# Patient Record
Sex: Female | Born: 2000 | Hispanic: Yes | Marital: Single | Smoking: Never smoker
Health system: Southern US, Community
[De-identification: ages and names within clinical notes are randomized; demographics above are authoritative.]

## PROBLEM LIST (undated history)

## (undated) DIAGNOSIS — R519 Headache, unspecified: Secondary | ICD-10-CM

## (undated) DIAGNOSIS — Z789 Other specified health status: Secondary | ICD-10-CM

## (undated) DIAGNOSIS — F32A Depression, unspecified: Secondary | ICD-10-CM

## (undated) HISTORY — PX: NO PAST SURGERIES: SHX2092

## (undated) HISTORY — PX: WISDOM TOOTH EXTRACTION: SHX21

---

## 2019-07-20 ENCOUNTER — Encounter (HOSPITAL_COMMUNITY): Payer: Self-pay

## 2019-07-20 ENCOUNTER — Ambulatory Visit (HOSPITAL_COMMUNITY)
Admission: EM | Admit: 2019-07-20 | Discharge: 2019-07-20 | Disposition: A | Discharge status: Home / Self Care | Payer: Self-pay | Attending: Family Medicine | Admitting: Family Medicine

## 2019-07-20 ENCOUNTER — Other Ambulatory Visit: Payer: Self-pay

## 2019-07-20 DIAGNOSIS — L259 Unspecified contact dermatitis, unspecified cause: Secondary | ICD-10-CM

## 2019-07-20 DIAGNOSIS — R220 Localized swelling, mass and lump, head: Secondary | ICD-10-CM

## 2019-07-20 MED ORDER — METHYLPREDNISOLONE SODIUM SUCC 125 MG IJ SOLR
125.0000 mg | Freq: Once | INTRAMUSCULAR | Status: AC
  Administered 2019-07-20: 125 mg via INTRAMUSCULAR

## 2019-07-20 MED ORDER — METHYLPREDNISOLONE SODIUM SUCC 125 MG IJ SOLR
INTRAMUSCULAR | Status: AC
  Filled 2019-07-20: qty 2

## 2019-07-20 MED ORDER — CETIRIZINE HCL 10 MG PO TABS
10.0000 mg | ORAL_TABLET | Freq: Every day | ORAL | 0 refills | Status: DC
Start: 2019-07-20 — End: 2020-05-04

## 2019-07-20 NOTE — ED Provider Notes (Signed)
[facility]   [id number] 07/20/19 Arrival Time: 1608  Cc: ALLERGIC REACTION  SUBJECTIVE:  Sydney Kelley is a 19 y.o. female who presents with possible allergic reaction that began yesterday. Reports upper and lower lip swelling last night. Took 2 Benadryl and swelling resolved. Reports upper lip swelling again today.  Denies precipitating event, exposure, known allergy or trigger. Denies changes in medication or starting a new medication.  Has tried benadryl with relief.  There do not seem to be aggravating factors.  Reports previous symptoms in the past that resolved with no treatment on her part.   Denies fever, chills, nausea, vomiting, erythema, redness, swollen glands, oral manifestations such as throat swelling/ tingling, mouth tingling, tongue swelling/tingling, dyspnea, SOB, chest pain, abdominal pain, changes in bowel or bladder function.     ROS: As per HPI.  All other pertinent ROS negative.     History reviewed. No pertinent past medical history. History reviewed. No pertinent surgical history. No Known Allergies No current facility-administered medications on file prior to encounter.   No current outpatient medications on file prior to encounter.    Social History   Socioeconomic History  . Marital status: Single    Spouse name: Not on file  . Number of children: Not on file  . Years of education: Not on file  . Highest education level: Not on file  Occupational History  . Not on file  Tobacco Use  . Smoking status: Never Smoker  . Smokeless tobacco: Never Used  Substance and Sexual Activity  . Alcohol use: Never  . Drug use: Never  . Sexual activity: Not on file  Other Topics Concern  . Not on file  Social History Narrative  . Not on file   Social Determinants of Health   Financial Resource Strain:   . Difficulty of Paying Living Expenses:   Food Insecurity:   . Worried About Programme researcher, broadcasting/film/video in the Last Year:   . Barista in the Last  Year:   Transportation Needs:   . Freight forwarder (Medical):   Marland Kitchen Lack of Transportation (Non-Medical):   Physical Activity:   . Days of Exercise per Week:   . Minutes of Exercise per Session:   Stress:   . Feeling of Stress :   Social Connections:   . Frequency of Communication with Friends and Family:   . Frequency of Social Gatherings with Friends and Family:   . Attends Religious Services:   . Active Member of Clubs or Organizations:   . Attends Banker Meetings:   Marland Kitchen Marital Status:   Intimate Partner Violence:   . Fear of Current or Ex-Partner:   . Emotionally Abused:   Marland Kitchen Physically Abused:   . Sexually Abused:    History reviewed. No pertinent family history.   OBJECTIVE:  Vitals:   07/20/19 1621  BP: 119/78  Pulse: 80  Resp: 16  Temp: 98 F (36.7 C)  SpO2: 98%     General appearance: Alert, speaking in full sentences without difficulty HEENT:NCAT; no obvious facial swelling; Ears: EACs clear, TMs pearly gray; Eyes: PERRL.  EOM grossly intact. Nose: nares patent without rhinorrhea; Throat: tonsils nonerythematous or enlarged, uvula midline Neck: supple without LAD Lungs: clear to auscultation bilaterally without adventitious breath sounds; normal respiratory effort; no labored respirations Heart: regular rate and rhythm.  Radial pulses 2+ symmetrical bilaterally; cap refill < 2 seconds Abdomen: soft, nondistended, normal active bowel sounds; nontender to palpation; no guarding  Skin: warm and dry Psychological: alert and cooperative; normal mood and affect  ASSESSMENT & PLAN:  1. Contact dermatitis, unspecified contact dermatitis type, unspecified trigger   2. Swelling of upper lip     Meds ordered this encounter  Medications  . cetirizine (ZYRTEC ALLERGY) 10 MG tablet    Sig: Take 1 tablet (10 mg total) by mouth daily.    Dispense:  30 tablet    Refill:  0    Order Specific Question:   Supervising Provider    Answer:   Chase Picket [id number]  . methylPREDNISolone sodium succinate (SOLU-MEDROL) 125 mg/2 mL injection 125 mg    No orders of the defined types were placed in this encounter.    Solumedrol given in office today May continue to use Benadryl as needed Rest push fluids Return or follow up with PCP in 24 hours to be reevaluated and to ensure your symptoms are improving Return sooner or go to the ED if you have any new or worsening symptoms such as difficulty breathing, shortness of breath, chest pain, nausea, vomiting, throat tightness or swelling, tongue swelling or tingling, worsening lip or facial swelling, abdominal pain, changes in bowel or bladder habits, no improvement despite medications.  Reviewed expectations re: course of current medical issues. Questions answered. Outlined signs and symptoms indicating need for more acute intervention. Patient verbalized understanding. After Visit Summary given.           Faustino Congress, NP 07/20/19 1713

## 2019-07-20 NOTE — ED Triage Notes (Signed)
Pt c/o hives and upper lip swelling since yesterday. Pt took benadryl last night, none today. No airway compromise. No known allergies.

## 2019-07-20 NOTE — Discharge Instructions (Signed)
You have had an allergic reaction to something  I have sent in zyrtec for you to take daily  You have received a steroid injection in the office today  Follow up as needed.

## 2019-09-12 ENCOUNTER — Ambulatory Visit (HOSPITAL_COMMUNITY)
Admission: EM | Admit: 2019-09-12 | Discharge: 2019-09-12 | Disposition: A | Discharge status: Home / Self Care | Payer: Medicaid Other | Attending: Family Medicine | Admitting: Family Medicine

## 2019-09-12 ENCOUNTER — Encounter (HOSPITAL_COMMUNITY): Payer: Self-pay

## 2019-09-12 DIAGNOSIS — Z3201 Encounter for pregnancy test, result positive: Secondary | ICD-10-CM

## 2019-09-12 LAB — POC URINE PREG, ED: Preg Test, Ur: POSITIVE — AB

## 2019-09-12 MED ORDER — PRENATAL COMPLETE 14-0.4 MG PO TABS
1.0000 | ORAL_TABLET | Freq: Every day | ORAL | 0 refills | Status: DC
Start: 2019-09-12 — End: 2022-01-10

## 2019-09-12 NOTE — Discharge Instructions (Signed)
Pregnancy test positive Follow up with OBGYN- [phone] [facility]  For nausea:  One-half of the 25 mg Unisom sleep tablet over-the-counter tablet or two chewable 5 mg tablets can be used off-label as an antiemetic. In addition, pyridoxine 25 mg, also available over-the-counter, is taken three or four times per day;This is a reasonable, less expensive substitute for combination tablets.

## 2019-09-12 NOTE — ED Triage Notes (Signed)
Pt presents to UC after +home pregnancy test. Requesting pregnancy test. No other complaints.

## 2019-09-12 NOTE — ED Provider Notes (Signed)
[facility]    CSN: [id number] Arrival date & time: 09/12/19  1539      History   Chief Complaint Chief Complaint  Patient presents with  . Possible Pregnancy    HPI Sydney Kelley is a 19 y.o. female presenting today for pregnancy test.  Patient reports that she had a positive pregnancy test at home.  Needing proof of pregnancy to get on Medicaid.  LMP was 06/04.  She denies any abdominal pain.  She has had some mild nausea and breast tenderness.  This will be patient's first pregnancy.  HPI  History reviewed. No pertinent past medical history.  There are no problems to display for this patient.   History reviewed. No pertinent surgical history.  OB History   No obstetric history on file.      Home Medications    Prior to Admission medications   Medication Sig Start Date End Date Taking? Authorizing Provider  cetirizine (ZYRTEC ALLERGY) 10 MG tablet Take 1 tablet (10 mg total) by mouth daily. Patient not taking: Reported on 09/12/2019 07/20/19   Moshe Cipro, NP  Prenatal Vit-Fe Fumarate-FA (PRENATAL COMPLETE) 14-0.4 MG TABS Take 1 tablet by mouth daily. 09/12/19   Khiana Camino, Junius Creamer, PA-C    Family History History reviewed. No pertinent family history.  Social History Social History   Tobacco Use  . Smoking status: Never Smoker  . Smokeless tobacco: Never Used  Vaping Use  . Vaping Use: Never used  Substance Use Topics  . Alcohol use: Never  . Drug use: Never     Allergies   Patient has no known allergies.   Review of Systems Review of Systems  Constitutional: Negative for fever.  Respiratory: Negative for shortness of breath.   Cardiovascular: Negative for chest pain.  Gastrointestinal: Negative for abdominal pain, diarrhea, nausea and vomiting.  Genitourinary: Positive for menstrual problem. Negative for dysuria, flank pain, genital sores, hematuria, vaginal bleeding, vaginal discharge and vaginal pain.  Musculoskeletal: Negative  for back pain.  Skin: Negative for rash.  Neurological: Negative for dizziness, light-headedness and headaches.     Physical Exam Triage Vital Signs ED Triage Vitals [09/12/19 1615]  Enc Vitals Group     BP 112/62     Pulse Rate 89     Resp 16     Temp 98.2 F (36.8 C)     Temp Source Oral     SpO2 100 %     Weight      Height      Head Circumference      Peak Flow      Pain Score 0     Pain Loc      Pain Edu?      Excl. in GC?    No data found.  Updated Vital Signs BP 112/62 (BP Location: Right Arm)   Pulse 89   Temp 98.2 F (36.8 C) (Oral)   Resp 16   LMP 08/08/2019 (Exact Date)   SpO2 100%   Visual Acuity Right Eye Distance:   Left Eye Distance:   Bilateral Distance:    Right Eye Near:   Left Eye Near:    Bilateral Near:     Physical Exam Vitals and nursing note reviewed.  Constitutional:      Appearance: She is well-developed.     Comments: No acute distress  HENT:     Head: Normocephalic and atraumatic.     Nose: Nose normal.  Eyes:     Conjunctiva/sclera: Conjunctivae  normal.  Cardiovascular:     Rate and Rhythm: Normal rate.  Pulmonary:     Effort: Pulmonary effort is normal. No respiratory distress.  Abdominal:     General: There is no distension.  Musculoskeletal:        General: Normal range of motion.     Cervical back: Neck supple.  Skin:    General: Skin is warm and dry.  Neurological:     Mental Status: She is alert and oriented to person, place, and time.      UC Treatments / Results  Labs (all labs ordered are listed, but only abnormal results are displayed) Labs Reviewed  POC URINE PREG, ED - Abnormal; Notable for the following components:      Result Value   Preg Test, Ur POSITIVE (*)    All other components within normal limits    EKG   Radiology No results found.  Procedures Procedures (including critical care time)  Medications Ordered in UC Medications - No data to display  Initial Impression /  Assessment and Plan / UC Course  I have reviewed the triage vital signs and the nursing notes.  Pertinent labs & imaging results that were available during my care of the patient were reviewed by me and considered in my medical decision making (see chart for details).     Pregnancy test positive, provided contact for follow-up with OB/GYN.  Initiated on prenatal vitamin and provided information on prenatal care.  Discussed medicines safe in pregnancy to use for nausea. Discussed strict return precautions. Patient verbalized understanding and is agreeable with plan.   Final Clinical Impressions(s) / UC Diagnoses   Final diagnoses:  Positive pregnancy test     Discharge Instructions     Pregnancy test positive Follow up with OBGYN- [phone] [facility]  For nausea:  One-half of the 25 mg Unisom sleep tablet over-the-counter tablet or two chewable 5 mg tablets can be used off-label as an antiemetic. In addition, pyridoxine 25 mg, also available over-the-counter, is taken three or four times per day;This is a reasonable, less expensive substitute for combination tablets.     ED Prescriptions    Medication Sig Dispense Auth. Provider   Prenatal Vit-Fe Fumarate-FA (PRENATAL COMPLETE) 14-0.4 MG TABS Take 1 tablet by mouth daily. 60 tablet Shaquia Berkley, [place] C, PA-C     PDMP not reviewed this encounter.   Lew Dawes, [place] 09/12/19 1647

## 2019-10-05 ENCOUNTER — Inpatient Hospital Stay (HOSPITAL_COMMUNITY): Payer: Medicaid Other

## 2019-10-05 ENCOUNTER — Ambulatory Visit (HOSPITAL_COMMUNITY)
Admission: EM | Admit: 2019-10-05 | Discharge: 2019-10-05 | Disposition: A | Discharge status: Home / Self Care | Payer: Medicaid Other

## 2019-10-05 ENCOUNTER — Other Ambulatory Visit: Payer: Self-pay

## 2019-10-05 ENCOUNTER — Encounter (HOSPITAL_COMMUNITY): Payer: Self-pay | Admitting: Obstetrics & Gynecology

## 2019-10-05 ENCOUNTER — Inpatient Hospital Stay (HOSPITAL_COMMUNITY)
Admission: AD | Admit: 2019-10-05 | Discharge: 2019-10-05 | Disposition: A | Discharge status: Home / Self Care | Payer: Medicaid Other | Attending: Obstetrics & Gynecology | Admitting: Obstetrics & Gynecology

## 2019-10-05 DIAGNOSIS — O26891 Other specified pregnancy related conditions, first trimester: Secondary | ICD-10-CM | POA: Insufficient documentation

## 2019-10-05 DIAGNOSIS — R1030 Lower abdominal pain, unspecified: Secondary | ICD-10-CM

## 2019-10-05 DIAGNOSIS — Z3A08 8 weeks gestation of pregnancy: Secondary | ICD-10-CM | POA: Diagnosis not present

## 2019-10-05 DIAGNOSIS — R102 Pelvic and perineal pain: Secondary | ICD-10-CM | POA: Diagnosis not present

## 2019-10-05 DIAGNOSIS — R109 Unspecified abdominal pain: Secondary | ICD-10-CM | POA: Insufficient documentation

## 2019-10-05 DIAGNOSIS — O3680X0 Pregnancy with inconclusive fetal viability, not applicable or unspecified: Secondary | ICD-10-CM

## 2019-10-05 HISTORY — DX: Other specified health status: Z78.9

## 2019-10-05 LAB — URINALYSIS, ROUTINE W REFLEX MICROSCOPIC
Bilirubin Urine: NEGATIVE
Glucose, UA: NEGATIVE mg/dL
Hgb urine dipstick: NEGATIVE
Ketones, ur: NEGATIVE mg/dL
Leukocytes,Ua: NEGATIVE
Nitrite: NEGATIVE
Protein, ur: NEGATIVE mg/dL
Specific Gravity, Urine: 1.024 (ref 1.005–1.030)
pH: 8 (ref 5.0–8.0)

## 2019-10-05 LAB — WET PREP, GENITAL
Clue Cells Wet Prep HPF POC: NONE SEEN
Sperm: NONE SEEN
Trich, Wet Prep: NONE SEEN
Yeast Wet Prep HPF POC: NONE SEEN

## 2019-10-05 LAB — CBC
HCT: 35.1 % — ABNORMAL LOW (ref 36.0–46.0)
Hemoglobin: 11.4 g/dL — ABNORMAL LOW (ref 12.0–15.0)
MCH: 26.9 pg (ref 26.0–34.0)
MCHC: 32.5 g/dL (ref 30.0–36.0)
MCV: 82.8 fL (ref 80.0–100.0)
Platelets: 169 10*3/uL (ref 150–400)
RBC: 4.24 MIL/uL (ref 3.87–5.11)
RDW: 14.4 % (ref 11.5–15.5)
WBC: 7.9 10*3/uL (ref 4.0–10.5)
nRBC: 0 % (ref 0.0–0.2)

## 2019-10-05 LAB — ABO/RH: ABO/RH(D): O POS

## 2019-10-05 LAB — HIV ANTIBODY (ROUTINE TESTING W REFLEX): HIV Screen 4th Generation wRfx: NONREACTIVE

## 2019-10-05 LAB — HCG, QUANTITATIVE, PREGNANCY: hCG, Beta Chain, Quant, S: 79936 m[IU]/mL — ABNORMAL HIGH (ref <5)

## 2019-10-05 NOTE — MAU Provider Note (Signed)
°  History     CSN: [id number]  Arrival date and time: 10/05/19 1145   None     Chief Complaint  Patient presents with   Abdominal Pain   HPI  Patient is a g1p0 at [redacted]w[redacted]d who presents for abdominal pain.  Woke up this morning with abdominal pain that comes and goes. Pain is 5/10. No diarrhea, last BM yesterday. Tolerating PO without nausea or vomiting. No fevers or chills. No vaginal bleeding. LMP approx 6/4.  No new sexual partners. No abnormal vaginal discharge. No dysuria. Has not had a dating ultrasound.     Past Medical History:  Diagnosis Date   Medical history non-contributory     Past Surgical History:  Procedure Laterality Date   NO PAST SURGERIES      History reviewed. No pertinent family history.  Social History   Tobacco Use   Smoking status: Never Smoker   Smokeless tobacco: Never Used  Vaping Use   Vaping Use: Never used  Substance Use Topics   Alcohol use: Never   Drug use: Never    Allergies: No Known Allergies  Medications Prior to Admission  Medication Sig Dispense Refill Last Dose   cetirizine (ZYRTEC ALLERGY) 10 MG tablet Take 1 tablet (10 mg total) by mouth daily. (Patient not taking: Reported on 09/12/2019) 30 tablet 0    Prenatal Vit-Fe Fumarate-FA (PRENATAL COMPLETE) 14-0.4 MG TABS Take 1 tablet by mouth daily. 60 tablet 0     Review of Systems  Constitutional: Negative.   Respiratory: Negative for shortness of breath.   Cardiovascular: Negative for chest pain.  Gastrointestinal: Negative for blood in stool, constipation, diarrhea, nausea and vomiting.  Genitourinary: Negative for dyspareunia, dysuria and flank pain.  Musculoskeletal: Negative for back pain.  Neurological: Negative for dizziness.   Physical Exam   Blood pressure 121/65, pulse 81, temperature 98.4 F (36.9 C), temperature source Oral, resp. rate 16, height 5\' 4"  (1.626 m), weight 90.3 kg, last menstrual period 08/08/2019, SpO2 100 %.  Physical Exam Vitals and  nursing note reviewed.  Constitutional:      Appearance: She is well-developed.  Cardiovascular:     Heart sounds: Normal heart sounds.  Pulmonary:     Effort: Pulmonary effort is normal.  Abdominal:     General: Bowel sounds are normal.     Palpations: Abdomen is soft.     Comments: Mild suprapubic tenderness without rebound or guarding   Skin:    General: Skin is warm and dry.  Neurological:     Mental Status: She is alert.     MAU Course  Procedures  MDM -pt seen and evaluated. Pregnancy of unknown location w lower abdominal pain -UA normal -HCG 79,936 -GC/Chlamydia, HIV pending -Wet Prep w WBC, no yeast, clue cells seen  -TVUS demonstrates viable IUP  -CBC/ABO obtained  Assessment and Plan   Abdominal pain  -reassuring examination and normal IUP on TVUS -pain improved upon reassessment. Normal UA, wet prep.  -f/u pending results. Patient stable for discharge home. Provided w return precautions.    10/08/2019 10/05/2019, 2:36 PM

## 2019-10-05 NOTE — ED Notes (Signed)
Per Wallis Bamberg PA, patient needs to go directly to MAU for follow up for acute abdominal pain.  Positive pregnancy test on 09/12/2019,

## 2019-10-05 NOTE — Discharge Instructions (Signed)
Abdominal Pain During Pregnancy  Belly (abdominal) pain is common during pregnancy. There are many possible causes. Most of the time, it is not a serious problem. Other times, it can be a sign that something is wrong with the pregnancy. Always tell your doctor if you have belly pain. Follow these instructions at home:  Do not have sex or put anything in your vagina until your pain goes away completely.  Get plenty of rest until your pain gets better.  Drink enough fluid to keep your pee (urine) pale yellow.  Take over-the-counter and prescription medicines only as told by your doctor.  Keep all follow-up visits as told by your doctor. This is important. Contact a doctor if:  Your pain continues or gets worse after resting.  You have lower belly pain that: ? Comes and goes at regular times. ? Spreads to your back. ? Feels like menstrual cramps.  You have pain or burning when you pee (urinate). Get help right away if:  You have a fever or chills.  You have vaginal bleeding.  You are leaking fluid from your vagina.  You are passing tissue from your vagina.  You throw up (vomit) for more than 24 hours.  You have watery poop (diarrhea) for more than 24 hours.  Your baby is moving less than usual.  You feel very weak or faint.  You have shortness of breath.  You have very bad pain in your upper belly. Summary  Belly (abdominal) pain is common during pregnancy. There are many possible causes.  If you have belly pain during pregnancy, tell your doctor right away.  Keep all follow-up visits as told by your doctor. This is important. This information is not intended to replace advice given to you by your health care provider. Make sure you discuss any questions you have with your health care provider. Document Revised: 06/10/2018 Document Reviewed: 05/25/2016 Elsevier Patient Education  2020 Elsevier Inc.  

## 2019-10-05 NOTE — MAU Note (Signed)
Sydney Kelley is a 19 y.o. at [redacted]w[redacted]d here in MAU reporting: woke up this AM with bad abdominal pain. No bleeding or discharge.   LMP: 08/08/19  Onset of complaint: today  Pain score: 5/10  Vitals:   10/05/19 1155  BP: 121/65  Pulse: 81  Resp: 16  Temp: 98.4 F (36.9 C)  SpO2: 100%     Lab orders placed from triage: UA

## 2019-10-06 LAB — GC/CHLAMYDIA PROBE AMP (~~LOC~~) NOT AT ARMC
Chlamydia: NEGATIVE
Comment: NEGATIVE
Comment: NORMAL
Neisseria Gonorrhea: NEGATIVE

## 2019-10-21 ENCOUNTER — Ambulatory Visit: Payer: Medicaid Other | Admitting: *Deleted

## 2019-10-21 DIAGNOSIS — Z3401 Encounter for supervision of normal first pregnancy, first trimester: Secondary | ICD-10-CM

## 2019-10-21 DIAGNOSIS — Z3400 Encounter for supervision of normal first pregnancy, unspecified trimester: Secondary | ICD-10-CM | POA: Insufficient documentation

## 2019-10-21 MED ORDER — BLOOD PRESSURE MONITOR KIT
1.0000 | PACK | Freq: Once | 0 refills | Status: AC
Start: 2019-10-21 — End: 2019-10-21

## 2019-10-21 NOTE — Progress Notes (Signed)
Patient was assessed and managed by nursing staff during this encounter. I have reviewed the chart and agree with the documentation and plan. I have also made any necessary editorial changes. ° °Kitana Gage A Kodah Maret, MD °10/21/2019 4:14 PM   °

## 2019-10-21 NOTE — Progress Notes (Signed)
I connected with  Sydney Kelley on 10/21/19 by a video enabled telemedicine application and verified that I am speaking with the correct person using two identifiers.   I discussed the limitations of evaluation and management by telemedicine. The patient expressed understanding and agreed to proceed.  PRENATAL INTAKE SUMMARY  Sydney Kelley presents today New OB Nurse Interview.  LMP: 08/08/2019 EDD: 05/14/2020  OB History    Gravida  1   Para      Term      Preterm      AB      Living        SAB      TAB      Ectopic      Multiple      Live Births            I have reviewed the patient's medical, obstetrical, social, and family histories, medications, and available lab results.  SUBJECTIVE She complains of nausea.  OBJECTIVE Initial Physical Exam (New OB)  GENERAL APPEARANCE: Sounds well via televisit   ASSESSMENT Normal pregnancy  PLAN Prenatal care [facility]  Take Vit B6 and Unisom for nausea - small meals - may hold off PNV if nausea is severe. BP cuff and BabyRx ordered today

## 2019-10-28 ENCOUNTER — Encounter: Payer: Self-pay | Admitting: Advanced Practice Midwife

## 2019-10-28 ENCOUNTER — Other Ambulatory Visit: Payer: Self-pay

## 2019-10-28 ENCOUNTER — Other Ambulatory Visit (HOSPITAL_COMMUNITY)
Admission: RE | Admit: 2019-10-28 | Discharge: 2019-10-28 | Disposition: A | Discharge status: Home / Self Care | Payer: Medicaid Other | Source: Ambulatory Visit | Attending: Advanced Practice Midwife | Admitting: Advanced Practice Midwife

## 2019-10-28 ENCOUNTER — Ambulatory Visit (INDEPENDENT_AMBULATORY_CARE_PROVIDER_SITE_OTHER): Payer: Medicaid Other | Admitting: Advanced Practice Midwife

## 2019-10-28 VITALS — BP 119/72 | HR 81 | Wt 197.4 lb

## 2019-10-28 DIAGNOSIS — O219 Vomiting of pregnancy, unspecified: Secondary | ICD-10-CM

## 2019-10-28 DIAGNOSIS — Z3401 Encounter for supervision of normal first pregnancy, first trimester: Secondary | ICD-10-CM | POA: Insufficient documentation

## 2019-10-28 DIAGNOSIS — Z3A11 11 weeks gestation of pregnancy: Secondary | ICD-10-CM

## 2019-10-28 MED ORDER — DOXYLAMINE-PYRIDOXINE 10-10 MG PO TBEC: DELAYED_RELEASE_TABLET | ORAL | 5 refills | Status: DC

## 2019-10-28 MED ORDER — ASPIRIN EC 81 MG PO TBEC: 81.0000 mg | DELAYED_RELEASE_TABLET | Freq: Every day | ORAL | 11 refills | Status: DC

## 2019-10-28 NOTE — Progress Notes (Signed)
Subjective:   Sydney Kelley is a 19 y.o. G1P0 at [redacted]w[redacted]d by LMP being seen today for her first obstetrical visit.  Her obstetrical history is significant for none and has Encounter for supervision of normal first pregnancy in first trimester on their problem list.. Patient does intend to breast feed. Pregnancy history fully reviewed.  Patient reports nausea.  HISTORY: OB History  Gravida Para Term Preterm AB Living  1 0 0 0 0 0  SAB TAB Ectopic Multiple Live Births  0 0 0 0 0    # Outcome Date GA Lbr Len/2nd Weight Sex Delivery Anes PTL Lv  1 Current            Past Medical History:  Diagnosis Date  . Medical history non-contributory    Past Surgical History:  Procedure Laterality Date  . NO PAST SURGERIES     Family History  Problem Relation Age of Onset  . Diabetes Father   . Diabetes Paternal Uncle    Social History   Tobacco Use  . Smoking status: Never Smoker  . Smokeless tobacco: Never Used  Vaping Use  . Vaping Use: Never used  Substance Use Topics  . Alcohol use: Never  . Drug use: Never   No Known Allergies Current Outpatient Medications on File Prior to Visit  Medication Sig Dispense Refill  . Prenatal Vit-Fe Fumarate-FA (PRENATAL COMPLETE) 14-0.4 MG TABS Take 1 tablet by mouth daily. 60 tablet 0  . cetirizine (ZYRTEC ALLERGY) 10 MG tablet Take 1 tablet (10 mg total) by mouth daily. (Patient not taking: Reported on 10/28/2019) 30 tablet 0   No current facility-administered medications on file prior to visit.     Indications for ASA therapy (per uptodate) One of the following: Previous pregnancy with preeclampsia, especially early onset and with an adverse outcome No Multifetal gestation No Chronic hypertension No Type 1 or 2 diabetes mellitus No Chronic kidney disease No Autoimmune disease (antiphospholipid syndrome, systemic lupus erythematosus) No   Two or more of the following: Nulliparity Yes Obesity (body mass index >30 kg/m2) Yes Family  history of preeclampsia in mother or sister No Age ?35 years No Sociodemographic characteristics (African American race, low socioeconomic level) Yes Personal risk factors (eg, previous pregnancy with low birth weight or small for gestational age infant, previous adverse pregnancy outcome [eg, stillbirth], interval >10 years between pregnancies) No   Indications for early 1 hour GTT (per uptodate)  BMI >25 (>23 in Asian women) AND one of the following  Gestational diabetes mellitus in a previous pregnancy No Glycated hemoglobin ?5.7 percent (39 mmol/mol), impaired glucose tolerance, or impaired fasting glucose on previous testing No First-degree relative with diabetes No High-risk race/ethnicity (eg, African American, Latino, Native American, [place] American, Pacific Islander) Yes History of cardiovascular disease No Hypertension or on therapy for hypertension No High-density lipoprotein cholesterol level <35 mg/dL (0.81 mmol/L) and/or a triglyceride level >250 mg/dL (4.48 mmol/L) No Polycystic ovary syndrome No Physical inactivity No Other clinical condition associated with insulin resistance (eg, severe obesity, acanthosis nigricans) No Previous birth of an infant weighing ?4000 g No Previous stillbirth of unknown cause No Exam   Vitals:   10/28/19 0926  BP: 119/72  Pulse: 81  Weight: 197 lb 6.4 oz (89.5 kg)   Fetal Heart Rate (bpm): 161  Uterus:     Pelvic Exam: Perineum: no hemorrhoids, normal perineum   Vulva: normal external genitalia, no lesions   Vagina:  normal mucosa, normal discharge   Cervix:  no lesions and normal, pap smear done.    Adnexa: normal adnexa and no mass, fullness, tenderness   Bony Pelvis: average  System: General: well-developed, well-nourished female in no acute distress   Breast:  normal appearance, no masses or tenderness   Skin: normal coloration and turgor, no rashes   Neurologic: oriented, normal, negative, normal mood   Extremities: normal  strength, tone, and muscle mass, ROM of all joints is normal   HEENT PERRLA, extraocular movement intact and sclera clear, anicteric   Mouth/Teeth mucous membranes moist, pharynx normal without lesions and dental hygiene good   Neck supple and no masses   Cardiovascular: regular rate and rhythm   Respiratory:  no respiratory distress, normal breath sounds   Abdomen: soft, non-tender; bowel sounds normal; no masses,  no organomegaly     Assessment:   Pregnancy: G1P0 Patient Active Problem List   Diagnosis Date Noted  . Encounter for supervision of normal first pregnancy in first trimester 10/21/2019     Plan:    1. Encounter for supervision of normal first pregnancy in first trimester --Anticipatory guidance about next visits/weeks of pregnancy given. --Next visit in 4 weeks in office for AFP --Pt meets criteria for early glucose screen with A1C and for Baby ASA daily to prevent PEC  - Culture, OB Urine - Genetic Screening - Cervicovaginal ancillary only( [place]) - Cytology - PAP - CBC/D/Plt+RPR+Rh+ABO+Rub Ab... - Hemoglobin A1c - aspirin EC 81 MG tablet; Take 1 tablet (81 mg total) by mouth daily. Swallow whole.  Dispense: 30 tablet; Refill: 11  2. Nausea and vomiting during pregnancy prior to [redacted] weeks gestation --Pt with mostly nausea at night and in the am, worse this week than before  - Doxylamine-Pyridoxine (DICLEGIS) 10-10 MG TBEC; Take 2 tabs at bedtime. If needed, add another tab in the morning. If needed, add another tab in the afternoon, up to 4 tabs/day.  Dispense: 100 tablet; Refill: 5  Initial labs drawn. Continue prenatal vitamins. Discussed and offered genetic screening options, including Quad screen/AFP, NIPS testing, and option to decline testing. Benefits/risks/alternatives reviewed. Pt aware that anatomy US is form of genetic screening with lower accuracy in detecting trisomies than blood work.  Pt chooses genetic screening today. NIPS:  ordered. Ultrasound discussed; fetal anatomic survey: requested. Problem list reviewed and updated. The nature of [facility] with multiple MDs and other Advanced Practice Providers was explained to patient; also emphasized that residents, students are part of our team. Routine obstetric precautions reviewed. Return in about 4 weeks (around 11/25/2019).   Sharen Counter, CNM 10/28/19 10:55 AM

## 2019-10-28 NOTE — Progress Notes (Signed)
Patient presents for New OB. Patient has no concerns today.  °

## 2019-10-29 LAB — CBC/D/PLT+RPR+RH+ABO+RUB AB...
Antibody Screen: NEGATIVE
Basophils Absolute: 0 10*3/uL (ref 0.0–0.2)
Basos: 0 %
EOS (ABSOLUTE): 0.1 10*3/uL (ref 0.0–0.4)
Eos: 1 %
HCV Ab: <0.1 s/co ratio (ref 0.0–0.9)
HIV Screen 4th Generation wRfx: NONREACTIVE
Hematocrit: 38.1 % (ref 34.0–46.6)
Hemoglobin: 12 g/dL (ref 11.1–15.9)
Hepatitis B Surface Ag: NEGATIVE
Immature Grans (Abs): 0 10*3/uL (ref 0.0–0.1)
Immature Granulocytes: 0 %
Lymphocytes Absolute: 1.5 10*3/uL (ref 0.7–3.1)
Lymphs: 16 %
MCH: 27.1 pg (ref 26.6–33.0)
MCHC: 31.5 g/dL (ref 31.5–35.7)
MCV: 86 fL (ref 79–97)
Monocytes Absolute: 0.4 10*3/uL (ref 0.1–0.9)
Monocytes: 4 %
Neutrophils Absolute: 7.4 10*3/uL — ABNORMAL HIGH (ref 1.4–7.0)
Neutrophils: 79 %
Platelets: 177 10*3/uL (ref 150–450)
RBC: 4.42 x10E6/uL (ref 3.77–5.28)
RDW: 15 % (ref 11.7–15.4)
RPR Ser Ql: NONREACTIVE
Rh Factor: POSITIVE
Rubella Antibodies, IGG: <0.9 index — ABNORMAL LOW (ref >0.99)
WBC: 9.4 10*3/uL (ref 3.4–10.8)

## 2019-10-29 LAB — HEMOGLOBIN A1C
Est. average glucose Bld gHb Est-mCnc: 114 mg/dL
Hgb A1c MFr Bld: 5.6 % (ref 4.8–5.6)

## 2019-10-29 LAB — CYTOLOGY - PAP

## 2019-10-29 LAB — CERVICOVAGINAL ANCILLARY ONLY
Chlamydia: NEGATIVE
Comment: NEGATIVE
Comment: NORMAL
Neisseria Gonorrhea: NEGATIVE

## 2019-10-29 LAB — HCV INTERPRETATION

## 2019-11-01 ENCOUNTER — Encounter: Payer: Self-pay | Admitting: Advanced Practice Midwife

## 2019-11-01 DIAGNOSIS — R87612 Low grade squamous intraepithelial lesion on cytologic smear of cervix (LGSIL): Secondary | ICD-10-CM | POA: Insufficient documentation

## 2019-11-02 LAB — CULTURE, OB URINE

## 2019-11-02 LAB — URINE CULTURE, OB REFLEX

## 2019-11-03 ENCOUNTER — Telehealth: Payer: Self-pay | Admitting: Advanced Practice Midwife

## 2019-11-03 DIAGNOSIS — O2341 Unspecified infection of urinary tract in pregnancy, first trimester: Secondary | ICD-10-CM

## 2019-11-03 MED ORDER — CEFADROXIL 500 MG PO CAPS: 500.0000 mg | ORAL_CAPSULE | Freq: Two times a day (BID) | ORAL | 0 refills | Status: AC

## 2019-11-03 NOTE — Telephone Encounter (Signed)
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  No answer and no VM.  Pt does not have MyChart set up yet.  Staff to attempt to call again to notify pt about abx sent to pharmacy and need for repeat Pap in 1 year related to LSIL.

## 2019-11-05 ENCOUNTER — Encounter: Payer: Self-pay | Admitting: Advanced Practice Midwife

## 2019-11-07 ENCOUNTER — Encounter: Payer: Self-pay | Admitting: Advanced Practice Midwife

## 2019-11-14 ENCOUNTER — Telehealth: Payer: Medicaid Other | Admitting: Physician Assistant

## 2019-11-14 ENCOUNTER — Inpatient Hospital Stay (HOSPITAL_COMMUNITY)
Admission: AD | Admit: 2019-11-14 | Discharge: 2019-11-14 | Disposition: A | Discharge status: Home / Self Care | Payer: Medicaid Other | Attending: Obstetrics & Gynecology | Admitting: Obstetrics & Gynecology

## 2019-11-14 ENCOUNTER — Other Ambulatory Visit: Payer: Self-pay

## 2019-11-14 ENCOUNTER — Encounter (HOSPITAL_COMMUNITY): Payer: Self-pay | Admitting: Obstetrics & Gynecology

## 2019-11-14 DIAGNOSIS — O219 Vomiting of pregnancy, unspecified: Secondary | ICD-10-CM

## 2019-11-14 DIAGNOSIS — G43009 Migraine without aura, not intractable, without status migrainosus: Secondary | ICD-10-CM | POA: Diagnosis not present

## 2019-11-14 DIAGNOSIS — Z7982 Long term (current) use of aspirin: Secondary | ICD-10-CM | POA: Diagnosis not present

## 2019-11-14 DIAGNOSIS — Z20822 Contact with and (suspected) exposure to covid-19: Secondary | ICD-10-CM | POA: Insufficient documentation

## 2019-11-14 DIAGNOSIS — R05 Cough: Secondary | ICD-10-CM | POA: Insufficient documentation

## 2019-11-14 DIAGNOSIS — Z3A14 14 weeks gestation of pregnancy: Secondary | ICD-10-CM | POA: Diagnosis not present

## 2019-11-14 DIAGNOSIS — O99352 Diseases of the nervous system complicating pregnancy, second trimester: Secondary | ICD-10-CM | POA: Diagnosis not present

## 2019-11-14 DIAGNOSIS — R110 Nausea: Secondary | ICD-10-CM | POA: Insufficient documentation

## 2019-11-14 DIAGNOSIS — O26892 Other specified pregnancy related conditions, second trimester: Secondary | ICD-10-CM | POA: Insufficient documentation

## 2019-11-14 LAB — CBC
HCT: 36.1 % (ref 36.0–46.0)
Hemoglobin: 11.6 g/dL — ABNORMAL LOW (ref 12.0–15.0)
MCH: 26.8 pg (ref 26.0–34.0)
MCHC: 32.1 g/dL (ref 30.0–36.0)
MCV: 83.4 fL (ref 80.0–100.0)
Platelets: 193 10*3/uL (ref 150–400)
RBC: 4.33 MIL/uL (ref 3.87–5.11)
RDW: 14.6 % (ref 11.5–15.5)
WBC: 10.4 10*3/uL (ref 4.0–10.5)
nRBC: 0 % (ref 0.0–0.2)

## 2019-11-14 LAB — URINALYSIS, ROUTINE W REFLEX MICROSCOPIC
Bilirubin Urine: NEGATIVE
Glucose, UA: NEGATIVE mg/dL
Hgb urine dipstick: NEGATIVE
Ketones, ur: 20 mg/dL — AB
Leukocytes,Ua: NEGATIVE
Nitrite: NEGATIVE
Protein, ur: NEGATIVE mg/dL
Specific Gravity, Urine: 1.012 (ref 1.005–1.030)
pH: 7 (ref 5.0–8.0)

## 2019-11-14 LAB — SARS CORONAVIRUS 2 BY RT PCR (HOSPITAL ORDER, PERFORMED IN ~~LOC~~ HOSPITAL LAB): SARS Coronavirus 2: NEGATIVE

## 2019-11-14 MED ORDER — DEXAMETHASONE SODIUM PHOSPHATE 10 MG/ML IJ SOLN
10.0000 mg | Freq: Once | INTRAMUSCULAR | Status: AC
  Administered 2019-11-14: 10 mg via INTRAVENOUS
  Filled 2019-11-14: qty 1

## 2019-11-14 MED ORDER — PROCHLORPERAZINE EDISYLATE 10 MG/2ML IJ SOLN
10.0000 mg | Freq: Once | INTRAMUSCULAR | Status: AC
  Administered 2019-11-14: 10 mg via INTRAVENOUS
  Filled 2019-11-14: qty 2

## 2019-11-14 MED ORDER — LACTATED RINGERS IV BOLUS
1000.0000 mL | Freq: Once | INTRAVENOUS | Status: AC
  Administered 2019-11-14: 1000 mL via INTRAVENOUS

## 2019-11-14 MED ORDER — DIPHENHYDRAMINE HCL 50 MG/ML IJ SOLN
25.0000 mg | Freq: Once | INTRAMUSCULAR | Status: AC
  Administered 2019-11-14: 25 mg via INTRAVENOUS
  Filled 2019-11-14: qty 1

## 2019-11-14 NOTE — Progress Notes (Signed)
For the safety of you and your child, I recommend a face to face office visit with a health care provider.  Migraines in pregnancy can be normal, but it is imperative that we rule out other more life threatening things before we treat a migraine.  You need to go to the MAU at [facility] or one of the listed urgent care centers for evaluation TODAY.  Many mothers need to take medicines during their pregnancy and while nursing.  Almost all medicines pass into the breast milk in small quantities.  Most are generally considered safe for a mother to take but some medicines must be avoided.  After reviewing your E-Visit request, I recommend that you consult your OB/GYN or pediatrician for medical advice in relation to your condition and prescription medications while pregnant or breastfeeding. NOTE: If you entered your credit card information for this eVisit, you will not be charged. You may see a "hold" on your card for the $35 but that hold will drop off and you will not have a charge processed.  If you are having a true medical emergency please call 911.     For an urgent face to face visit, [place] has four urgent care centers for your convenience:    NEW:  [facility] [place] [phone] [address] [place], [place] [address] .  Monday - Friday 10 am - 6 pm     . [facility]    [phone]                  Get Driving Directions  [address] [place], [place] [address] . 10 am to 8 pm Monday-Friday . 12 pm to 8 pm Saturday-Sunday   . [facility] at [facility]  [phone]                  Get Driving Directions  [address], [address] [place], [place] [address] . 8 am to 8 pm Monday-Friday . 9 am to 6 pm Saturday . 11 am to 6 pm Sunday   . [facility] at [facility]  [phone]                  Get Driving Directions   [address] [place], [place]  [address] . 8 am to 8 pm Monday-Friday . 8 am to 4 pm Saturday-Sunday    . [facility] at [place] Directions  [phone]  [address], [address] [place], [place] [address]  . Monday-Friday, 12 PM to 6 PM    Your e-visit answers were reviewed by a board certified advanced clinical practitioner to complete your personal care plan.  Thank you for using e-Visits.  Greater than 5 minutes, yet less than 10 minutes of time have been spent researching, coordinating, and implementing care for this patient today

## 2019-11-14 NOTE — MAU Provider Note (Signed)
History     CSN: [id number]  Arrival date and time: 11/14/19 1520   First Provider Initiated Contact with Patient 11/14/19 1829      Chief Complaint  Patient presents with  . Headache  . Nausea   HPI  Sydney Kelley is a 19 y.o. G1P0 at [redacted]w[redacted]d who present to the MAU due to migraine for 5 days. She woke up with the migraine on the R side and it has slowly spread to the L side. She has not had any visual changes, weakness, tingling, numbness. She has had nausea throughout, but only vomited 1-2x (NBNB) today. She tried 500 mg of tylenol without relief. She has a history of migraines associated with her menstrual cycle, but those are normally shorter and respond to tylenol. She does report a non-productive cough that began 2 days ago. She is vaccinated for COVID 19. Denies fever, sick contacts, congestion, shortness of breath, diarrhea, constipation, vaginal bleeding, changes in discharge. She endorses a diet of vegetables and fruit for 2-3 months due to pregnancy associated nausea and reports no protein intake.   OB History    Gravida  1   Para      Term      Preterm      AB      Living        SAB      TAB      Ectopic      Multiple      Live Births              Past Medical History:  Diagnosis Date  . Medical history non-contributory     Past Surgical History:  Procedure Laterality Date  . NO PAST SURGERIES      Family History  Problem Relation Age of Onset  . Diabetes Father   . Diabetes Paternal Uncle     Social History   Tobacco Use  . Smoking status: Never Smoker  . Smokeless tobacco: Never Used  Vaping Use  . Vaping Use: Never used  Substance Use Topics  . Alcohol use: Never  . Drug use: Never    Allergies: No Known Allergies  Medications Prior to Admission  Medication Sig Dispense Refill Last Dose  . acetaminophen (TYLENOL) 500 MG tablet Take 1,000 mg by mouth every 6 (six) hours as needed.   Past Week at Unknown time  . aspirin EC 81  MG tablet Take 1 tablet (81 mg total) by mouth daily. Swallow whole. 30 tablet 11 11/14/2019 at Unknown time  . Doxylamine-Pyridoxine (DICLEGIS) 10-10 MG TBEC Take 2 tabs at bedtime. If needed, add another tab in the morning. If needed, add another tab in the afternoon, up to 4 tabs/day. 100 tablet 5 Past Month at Unknown time  . Prenatal Vit-Fe Fumarate-FA (PRENATAL COMPLETE) 14-0.4 MG TABS Take 1 tablet by mouth daily. 60 tablet 0 11/14/2019 at Unknown time  . cetirizine (ZYRTEC ALLERGY) 10 MG tablet Take 1 tablet (10 mg total) by mouth daily. (Patient not taking: Reported on 10/28/2019) 30 tablet 0     Review of Systems  Constitutional: Negative for chills and fever.  HENT: Negative for congestion, sneezing and sore throat.   Eyes: Positive for photophobia. Negative for visual disturbance.  Respiratory: Negative for cough and shortness of breath.   Cardiovascular: Negative for leg swelling.  Gastrointestinal: Positive for nausea and vomiting. Negative for abdominal pain, constipation and diarrhea.  Genitourinary: Negative for vaginal bleeding and vaginal discharge.  Neurological: Positive for headaches.  Negative for weakness.   Physical Exam   Blood pressure 120/65, pulse 90, temperature 99 F (37.2 C), temperature source Oral, resp. rate 16, height 5\' 4"  (1.626 m), weight 89.8 kg, last menstrual period 08/08/2019, SpO2 98 %.  Physical Exam Eyes:     General: No visual field deficit. Cardiovascular:     Rate and Rhythm: Normal rate and regular rhythm.     Heart sounds: Normal heart sounds.  Pulmonary:     Effort: Pulmonary effort is normal.     Breath sounds: Normal breath sounds.  Abdominal:     General: Bowel sounds are normal.     Palpations: Abdomen is soft.     Tenderness: There is no abdominal tenderness.  Skin:    General: Skin is warm and dry.     Capillary Refill: Capillary refill takes less than 2 seconds.  Neurological:     General: No focal deficit present.      Mental Status: She is alert and oriented to person, place, and time.     Cranial Nerves: No cranial nerve deficit, dysarthria or facial asymmetry.  Psychiatric:        Mood and Affect: Mood normal.        Speech: Speech normal.     MAU Course  Procedures   Meds ordered this encounter  Medications  . lactated ringers bolus 1,000 mL  . prochlorperazine (COMPAZINE) injection 10 mg  . dexamethasone (DECADRON) injection 10 mg  . diphenhydrAMINE (BENADRYL) injection 25 mg   MDM Results for orders placed or performed during the hospital encounter of 11/14/19 (from the past 24 hour(s))  Urinalysis, Routine w reflex microscopic Urine, Clean Catch     Status: Abnormal   Collection Time: 11/14/19  5:39 PM  Result Value Ref Range   Color, Urine YELLOW YELLOW   APPearance CLOUDY (A) CLEAR   Specific Gravity, Urine 1.012 1.005 - 1.030   pH 7.0 5.0 - 8.0   Glucose, UA NEGATIVE NEGATIVE mg/dL   Hgb urine dipstick NEGATIVE NEGATIVE   Bilirubin Urine NEGATIVE NEGATIVE   Ketones, ur 20 (A) NEGATIVE mg/dL   Protein, ur NEGATIVE NEGATIVE mg/dL   Nitrite NEGATIVE NEGATIVE   Leukocytes,Ua NEGATIVE NEGATIVE  CBC     Status: Abnormal   Collection Time: 11/14/19  7:00 PM  Result Value Ref Range   WBC 10.4 4.0 - 10.5 K/uL   RBC 4.33 3.87 - 5.11 MIL/uL   Hemoglobin 11.6 (L) 12.0 - 15.0 g/dL   HCT 01/14/20 36 - 46 %   MCV 83.4 80.0 - 100.0 fL   MCH 26.8 26.0 - 34.0 pg   MCHC 32.1 30.0 - 36.0 g/dL   RDW 25.4 98.2 - 64.1 %   Platelets 193 150 - 400 K/uL   nRBC 0.0 0.0 - 0.2 %    Assessment and Plan  Migraine in pregnancy  Given history of migraine headaches associated with menstrual cycle and no other concerning symptoms presentation is likely due to migraine headache worse than prior ones due to increased hormonal level in early pregnancy. - D/c home in stable condition  - given return percautions - instructions to follow up with outpatient OBGYN on already scheduled appointment 09/21 and  informed of headache specialist available if needed at [facility]   Screening for COVID In light of delta strain of COVID19 having more subtle presentation and presence of head, nausea, vomiting, and cough patient was screened for COVID19 despite vaccination status.  - will contact if COVID swab is  positive  Concern for anemia Patient was screened for anemia given recent diet. CBC showed mild anemia with normal MCV. Hgb was near her base line. No concern for anemia contributing to presentation.    Norton Blizzard 11/14/2019, 7:35 PM

## 2019-11-14 NOTE — MAU Provider Note (Signed)
History     CSN: [id number]  Arrival date and time: 11/14/19 1520   First Provider Initiated Contact with Patient 11/14/19 1829      Chief Complaint  Patient presents with  . Headache  . Nausea   HPI Sydney Kelley is a 19 y.o. G1P0 at [redacted]w[redacted]d who present to the MAU due to migraine for 5 days. She woke up with the migraine on the R side and it has slowly spread to the L side. She has not had any visual changes, weakness, tingling, numbness. She has had nausea throughout, but only vomited 1-2x today. She tried 500 mg of tylenol without relief. She has a history of migraines associated with her menstrual cycle, but those are normally shorter and respond to tylenol. She does report a non-productive cough that began 2 days ago. She is vaccinated for COVID 19. Denies fever, sick contacts, congestion, shortness of breath, diarrhea, constipation, vaginal bleeding, changes in discharge. She endorses a diet of vegetables and fruit for 2-3 months due to pregnancy associated nausea and reports no protein intake.   OB History    Gravida  1   Para      Term      Preterm      AB      Living        SAB      TAB      Ectopic      Multiple      Live Births              Past Medical History:  Diagnosis Date  . Medical history non-contributory     Past Surgical History:  Procedure Laterality Date  . NO PAST SURGERIES      Family History  Problem Relation Age of Onset  . Diabetes Father   . Diabetes Paternal Uncle     Social History   Tobacco Use  . Smoking status: Never Smoker  . Smokeless tobacco: Never Used  Vaping Use  . Vaping Use: Never used  Substance Use Topics  . Alcohol use: Never  . Drug use: Never    Allergies: No Known Allergies  Medications Prior to Admission  Medication Sig Dispense Refill Last Dose  . acetaminophen (TYLENOL) 500 MG tablet Take 1,000 mg by mouth every 6 (six) hours as needed.   Past Week at Unknown time  . aspirin EC 81 MG  tablet Take 1 tablet (81 mg total) by mouth daily. Swallow whole. 30 tablet 11 11/14/2019 at Unknown time  . Doxylamine-Pyridoxine (DICLEGIS) 10-10 MG TBEC Take 2 tabs at bedtime. If needed, add another tab in the morning. If needed, add another tab in the afternoon, up to 4 tabs/day. 100 tablet 5 Past Month at Unknown time  . Prenatal Vit-Fe Fumarate-FA (PRENATAL COMPLETE) 14-0.4 MG TABS Take 1 tablet by mouth daily. 60 tablet 0 11/14/2019 at Unknown time  . cetirizine (ZYRTEC ALLERGY) 10 MG tablet Take 1 tablet (10 mg total) by mouth daily. (Patient not taking: Reported on 10/28/2019) 30 tablet 0     Review of Systems  Constitutional: Negative.  Negative for fatigue and fever.  HENT: Negative.   Respiratory: Positive for cough. Negative for shortness of breath.   Cardiovascular: Negative.  Negative for chest pain.  Gastrointestinal: Positive for nausea and vomiting. Negative for abdominal pain, constipation and diarrhea.  Genitourinary: Negative for dysuria, vaginal bleeding and vaginal discharge.  Neurological: Positive for headaches. Negative for dizziness.   Physical Exam   Blood pressure  120/65, pulse 90, temperature 99 F (37.2 C), temperature source Oral, resp. rate 16, height 5\' 4"  (1.626 m), weight 89.8 kg, last menstrual period 08/08/2019, SpO2 98 %.  Physical Exam Vitals and nursing note reviewed.  Constitutional:      General: She is not in acute distress.    Appearance: She is well-developed.  HENT:     Head: Normocephalic.  Eyes:     General: No visual field deficit.    Pupils: Pupils are equal, round, and reactive to light.  Cardiovascular:     Rate and Rhythm: Normal rate and regular rhythm.     Heart sounds: Normal heart sounds.  Pulmonary:     Effort: Pulmonary effort is normal. No respiratory distress.     Breath sounds: Normal breath sounds.  Abdominal:     General: Bowel sounds are normal. There is no distension.     Palpations: Abdomen is soft.      Tenderness: There is no abdominal tenderness.  Skin:    General: Skin is warm and dry.  Neurological:     Mental Status: She is alert and oriented to person, place, and time.     Cranial Nerves: No cranial nerve deficit or facial asymmetry.  Psychiatric:        Behavior: Behavior normal.        Thought Content: Thought content normal.        Judgment: Judgment normal.    FHT: 145 bpm  MAU Course  Procedures Results for orders placed or performed during the hospital encounter of 11/14/19 (from the past 24 hour(s))  Urinalysis, Routine w reflex microscopic Urine, Clean Catch     Status: Abnormal   Collection Time: 11/14/19  5:39 PM  Result Value Ref Range   Color, Urine YELLOW YELLOW   APPearance CLOUDY (A) CLEAR   Specific Gravity, Urine 1.012 1.005 - 1.030   pH 7.0 5.0 - 8.0   Glucose, UA NEGATIVE NEGATIVE mg/dL   Hgb urine dipstick NEGATIVE NEGATIVE   Bilirubin Urine NEGATIVE NEGATIVE   Ketones, ur 20 (A) NEGATIVE mg/dL   Protein, ur NEGATIVE NEGATIVE mg/dL   Nitrite NEGATIVE NEGATIVE   Leukocytes,Ua NEGATIVE NEGATIVE   MDM UA LR bolus HA cocktail- compazine, benedryl, decadron IV CBC  Discussed testing for COVID-19 due to symptoms. Reviewed that it is unlikely due to vaccination status but not impossible. Patient agreeable to testing. Will call with results if not back by discharge. Covid-19 swab  Assessment and Plan   1. Migraine without aura and without status migrainosus, not intractable   2. [redacted] weeks gestation of pregnancy    -Discharge home in stable condition -Headache precautions discussed -Patient advised to follow-up with OB as scheduled for prenatal care -Patient may return to MAU as needed or if her condition were to change or worsen   01/14/20 CNM 11/14/2019, 6:59 PM

## 2019-11-14 NOTE — MAU Note (Addendum)
About 5 days ago, woke up with a migraine, hasn't gone away since. Comes and goes, but keeps getting worse. Started on left side of face working its way to left. Has had some nausea and vomiting with it.  Tried to take Tylenol for it, but it didn't help at.  Took 1000mg  Tylenol one time. No OB complaints.

## 2019-11-14 NOTE — Discharge Instructions (Signed)

## 2019-11-25 ENCOUNTER — Ambulatory Visit (INDEPENDENT_AMBULATORY_CARE_PROVIDER_SITE_OTHER): Payer: Medicaid Other | Admitting: Obstetrics & Gynecology

## 2019-11-25 ENCOUNTER — Other Ambulatory Visit: Payer: Self-pay

## 2019-11-25 VITALS — BP 116/69 | HR 84 | Wt 201.6 lb

## 2019-11-25 DIAGNOSIS — O99210 Obesity complicating pregnancy, unspecified trimester: Secondary | ICD-10-CM

## 2019-11-25 DIAGNOSIS — Z3A15 15 weeks gestation of pregnancy: Secondary | ICD-10-CM

## 2019-11-25 DIAGNOSIS — Z3402 Encounter for supervision of normal first pregnancy, second trimester: Secondary | ICD-10-CM

## 2019-11-25 DIAGNOSIS — O2341 Unspecified infection of urinary tract in pregnancy, first trimester: Secondary | ICD-10-CM

## 2019-11-25 NOTE — Progress Notes (Signed)
   PRENATAL VISIT NOTE  Subjective:  Symiah Nowotny is a 19 y.o. G1P0 at [redacted]w[redacted]d being seen today for ongoing prenatal care.  She is currently monitored for the following issues for this low-risk pregnancy and has Encounter for supervision of normal first pregnancy and LGSIL on Pap smear of cervix on their problem list.  Patient reports no complaints.  Contractions: Irritability. Vag. Bleeding: None.   . Denies leaking of fluid.   The following portions of the patient's history were reviewed and updated as appropriate: allergies, current medications, past family history, past medical history, past social history, past surgical history and problem list.   Objective:   Vitals:   11/25/19 1002  BP: 116/69  Pulse: 84  Weight: 201 lb 9.6 oz (91.4 kg)    Fetal Status:           General:  Alert, oriented and cooperative. Patient is in no acute distress.  Skin: Skin is warm and dry. No rash noted.   Cardiovascular: Normal heart rate noted  Respiratory: Normal respiratory effort, no problems with respiration noted  Abdomen: Soft, gravid, appropriate for gestational age.  Pain/Pressure: Absent     Pelvic: Cervical exam deferred        Extremities: Normal range of motion.  Edema: None  Mental Status: Normal mood and affect. Normal behavior. Normal judgment and thought content.   Assessment and Plan:  Pregnancy: G1P0 at [redacted]w[redacted]d 1. UTI (urinary tract infection) during pregnancy, first trimester Had E.coli UTI in 10/2019, this is for [facility] - Culture, OB Urine  2. Obesity in pregnancy, antepartum TWG 21 lb. Cautioned about this, will monitor diet. - [place] MFM OB DETAIL +14 WK; Future  3. [redacted] weeks gestation of pregnancy 4. Encounter for supervision of normal first pregnancy in second trimester Low risk NIPS. AFP only today. Anatomy scan to be scheduled.  - AFP, Serum, Open Spina Bifida - [place] MFM OB DETAIL +14 WK; Future  No other complaints or concerns.  Routine obstetric precautions  reviewed.  Please refer to After Visit Summary for other counseling recommendations.   Return in about 4 weeks (around 12/23/2019) for OFFICE OB Visit.  No future appointments.  Jaynie Collins, MD

## 2019-11-25 NOTE — Patient Instructions (Signed)
Return to office for any scheduled appointments. Call the office or go to the [facility] at [facility] if:  You begin to have strong, frequent contractions  Your water breaks.  Sometimes it is a big gush of fluid, sometimes it is just a trickle that keeps getting your panties wet or running down your legs  You have vaginal bleeding.  It is normal to have a small amount of spotting if your cervix was checked.   Any other obstetric concerns.   Second Trimester of Pregnancy The second trimester is from week 14 through week 27 (months 4 through 6). The second trimester is often a time when you feel your best. Your body has adjusted to being pregnant, and you begin to feel better physically. Usually, morning sickness has lessened or quit completely, you may have more energy, and you may have an increase in appetite. The second trimester is also a time when the fetus is growing rapidly. At the end of the sixth month, the fetus is about 9 inches long and weighs about 1 pounds. You will likely begin to feel the baby move (quickening) between 16 and 20 weeks of pregnancy. Body changes during your second trimester Your body continues to go through many changes during your second trimester. The changes vary from woman to woman.  Your weight will continue to increase. You will notice your lower abdomen bulging out.  You may begin to get stretch marks on your hips, abdomen, and breasts.  You may develop headaches that can be relieved by medicines. The medicines should be approved by your health care provider.  You may urinate more often because the fetus is pressing on your bladder.  You may develop or continue to have heartburn as a result of your pregnancy.  You may develop constipation because certain hormones are causing the muscles that push waste through your intestines to slow down.  You may develop hemorrhoids or swollen, bulging veins (varicose veins).  You may have  back pain. This is caused by: ? Weight gain. ? Pregnancy hormones that are relaxing the joints in your pelvis. ? A shift in weight and the muscles that support your balance.  Your breasts will continue to grow and they will continue to become tender.  Your gums may bleed and may be sensitive to brushing and flossing.  Dark spots or blotches (chloasma, mask of pregnancy) may develop on your face. This will likely fade after the baby is born.  A dark line from your belly button to the pubic area (linea nigra) may appear. This will likely fade after the baby is born.  You may have changes in your hair. These can include thickening of your hair, rapid growth, and changes in texture. Some women also have hair loss during or after pregnancy, or hair that feels dry or thin. Your hair will most likely return to normal after your baby is born. What to expect at prenatal visits During a routine prenatal visit:  You will be weighed to make sure you and the fetus are growing normally.  Your blood pressure will be taken.  Your abdomen will be measured to track your baby's growth.  The fetal heartbeat will be listened to.  Any test results from the previous visit will be discussed. Your health care provider may ask you:  How you are feeling.  If you are feeling the baby move.  If you have had any abnormal symptoms, such as leaking fluid, bleeding,   fluid, bleeding, severe headaches, or abdominal cramping.  If you are using any tobacco products, including cigarettes, chewing tobacco, and electronic cigarettes.  If you have any questions. Other tests that may be performed during your second trimester include:  Blood tests that check for: ? Low iron levels (anemia). ? High blood sugar that affects pregnant women (gestational diabetes) between 89 and 28 weeks. ? Rh antibodies. This is to check for a protein on red blood cells (Rh factor).  Urine tests to check for infections, diabetes, or protein in the  urine.  An ultrasound to confirm the proper growth and development of the baby.  An amniocentesis to check for possible genetic problems.  Fetal screens for spina bifida and Down syndrome.  HIV (human immunodeficiency virus) testing. Routine prenatal testing includes screening for HIV, unless you choose not to have this test. Follow these instructions at home: Medicines  Follow your health care provider's instructions regarding medicine use. Specific medicines may be either safe or unsafe to take during pregnancy.  Take a prenatal vitamin that contains at least 600 micrograms (mcg) of folic acid.  If you develop constipation, try taking a stool softener if your health care provider approves. Eating and drinking   Eat a balanced diet that includes fresh fruits and vegetables, whole grains, good sources of protein such as meat, eggs, or tofu, and low-fat dairy. Your health care provider will help you determine the amount of weight gain that is right for you.  Avoid raw meat and uncooked cheese. These carry germs that can cause birth defects in the baby.  If you have low calcium intake from food, talk to your health care provider about whether you should take a daily calcium supplement.  Limit foods that are high in fat and processed sugars, such as fried and sweet foods.  To prevent constipation: ? Drink enough fluid to keep your urine clear or pale yellow. ? Eat foods that are high in fiber, such as fresh fruits and vegetables, whole grains, and beans. Activity  Exercise only as directed by your health care provider. Most women can continue their usual exercise routine during pregnancy. Try to exercise for 30 minutes at least 5 days a week. Stop exercising if you experience uterine contractions.  Avoid heavy lifting, wear low heel shoes, and practice good posture.  A sexual relationship may be continued unless your health care provider directs you otherwise. Relieving pain and  discomfort  Wear a good support bra to prevent discomfort from breast tenderness.  Take warm sitz baths to soothe any pain or discomfort caused by hemorrhoids. Use hemorrhoid cream if your health care provider approves.  Rest with your legs elevated if you have leg cramps or low back pain.  If you develop varicose veins, wear support hose. Elevate your feet for 15 minutes, 3-4 times a day. Limit salt in your diet. Prenatal Care  Write down your questions. Take them to your prenatal visits.  Keep all your prenatal visits as told by your health care provider. This is important. Safety  Wear your seat belt at all times when driving.  Make a list of emergency phone numbers, including numbers for family, friends, the hospital, and police and fire departments. General instructions  Ask your health care provider for a referral to a local prenatal education class. Begin classes no later than the beginning of month 6 of your pregnancy.  Ask for help if you have counseling or nutritional needs during pregnancy. Your health care provider  can offer advice or refer you to specialists for help with various needs.  Do not use hot tubs, steam rooms, or saunas.  Do not douche or use tampons or scented sanitary pads.  Do not cross your legs for long periods of time.  Avoid cat litter boxes and soil used by cats. These carry germs that can cause birth defects in the baby and possibly loss of the fetus by miscarriage or stillbirth.  Avoid all smoking, herbs, alcohol, and unprescribed drugs. Chemicals in these products can affect the formation and growth of the baby.  Do not use any products that contain nicotine or tobacco, such as cigarettes and e-cigarettes. If you need help quitting, ask your health care provider.  Visit your dentist if you have not gone yet during your pregnancy. Use a soft toothbrush to brush your teeth and be gentle when you floss. Contact a health care provider if:  You  have dizziness.  You have mild pelvic cramps, pelvic pressure, or nagging pain in the abdominal area.  You have persistent nausea, vomiting, or diarrhea.  You have a bad smelling vaginal discharge.  You have pain when you urinate. Get help right away if:  You have a fever.  You are leaking fluid from your vagina.  You have spotting or bleeding from your vagina.  You have severe abdominal cramping or pain.  You have rapid weight gain or weight loss.  You have shortness of breath with chest pain.  You notice sudden or extreme swelling of your face, hands, ankles, feet, or legs.  You have not felt your baby move in over an hour.  You have severe headaches that do not go away when you take medicine.  You have vision changes. Summary  The second trimester is from week 14 through week 27 (months 4 through 6). It is also a time when the fetus is growing rapidly.  Your body goes through many changes during pregnancy. The changes vary from woman to woman.  Avoid all smoking, herbs, alcohol, and unprescribed drugs. These chemicals affect the formation and growth your baby.  Do not use any tobacco products, such as cigarettes, chewing tobacco, and e-cigarettes. If you need help quitting, ask your health care provider.  Contact your health care provider if you have any questions. Keep all prenatal visits as told by your health care provider. This is important. This information is not intended to replace advice given to you by your health care provider. Make sure you discuss any questions you have with your health care provider. Document Revised: 06/14/2018 Document Reviewed: 03/28/2016 Elsevier Patient Education  2020 [employer].

## 2019-11-27 LAB — AFP, SERUM, OPEN SPINA BIFIDA
AFP MoM: 0.81
AFP Value: 21.9 ng/mL
Gest. Age on Collection Date: 15.6 weeks
Maternal Age At EDD: 21.7 yr
OSBR Risk 1 IN: 10000
Test Results:: NEGATIVE
Weight: 201 [lb_av]

## 2019-11-27 LAB — URINE CULTURE, OB REFLEX

## 2019-11-27 LAB — CULTURE, OB URINE

## 2019-12-23 ENCOUNTER — Other Ambulatory Visit: Payer: Self-pay

## 2019-12-23 ENCOUNTER — Ambulatory Visit (INDEPENDENT_AMBULATORY_CARE_PROVIDER_SITE_OTHER): Payer: Medicaid Other | Admitting: Advanced Practice Midwife

## 2019-12-23 VITALS — BP 112/70 | HR 90 | Wt 209.4 lb

## 2019-12-23 DIAGNOSIS — O26899 Other specified pregnancy related conditions, unspecified trimester: Secondary | ICD-10-CM

## 2019-12-23 DIAGNOSIS — Z3A19 19 weeks gestation of pregnancy: Secondary | ICD-10-CM

## 2019-12-23 DIAGNOSIS — R102 Pelvic and perineal pain: Secondary | ICD-10-CM

## 2019-12-23 DIAGNOSIS — O2341 Unspecified infection of urinary tract in pregnancy, first trimester: Secondary | ICD-10-CM

## 2019-12-23 DIAGNOSIS — Z3401 Encounter for supervision of normal first pregnancy, first trimester: Secondary | ICD-10-CM

## 2019-12-23 MED ORDER — COMFORT FIT MATERNITY SUPP MED MISC: 1.0000 | Freq: Every day | 0 refills | Status: DC

## 2019-12-23 NOTE — Progress Notes (Signed)
   PRENATAL VISIT NOTE  Subjective:  Sydney Kelley is a 19 y.o. G1P0 at [redacted]w[redacted]d being seen today for ongoing prenatal care.  She is currently monitored for the following issues for this low-risk pregnancy and has Encounter for supervision of normal first pregnancy and LGSIL on Pap smear of cervix on their problem list.  Patient reports bilateral groin/lower abdomen pain with movement.  Contractions: Irritability. Vag. Bleeding: None.   . Denies leaking of fluid.   The following portions of the patient's history were reviewed and updated as appropriate: allergies, current medications, past family history, past medical history, past social history, past surgical history and problem list.   Objective:   Vitals:   12/23/19 1040  BP: 112/70  Pulse: 90  Weight: 209 lb 6.4 oz (95 kg)    Fetal Status: Fetal Heart Rate (bpm): 150         General:  Alert, oriented and cooperative. Patient is in no acute distress.  Skin: Skin is warm and dry. No rash noted.   Cardiovascular: Normal heart rate noted  Respiratory: Normal respiratory effort, no problems with respiration noted  Abdomen: Soft, gravid, appropriate for gestational age.  Pain/Pressure: Absent     Pelvic: Cervical exam deferred        Extremities: Normal range of motion.  Edema: None  Mental Status: Normal mood and affect. Normal behavior. Normal judgment and thought content.   Assessment and Plan:  Pregnancy: G1P0 at [redacted]w[redacted]d 1. Encounter for supervision of normal first pregnancy in first trimester --Anticipatory guidance about next visits/weeks of pregnancy given. --Next visit in 4 weeks, virtual  2. UTI (urinary tract infection) during pregnancy, first trimester --TOC neg  3. [redacted] weeks gestation of pregnancy  4. Pain of round ligament affecting pregnancy, antepartum --Rest/ice/heat/warm bath/Tylenol/pregnancy support belt  - Elastic Bandages & Supports (COMFORT FIT MATERNITY SUPP MED) MISC; 1 Device by Does not apply route  daily.  Dispense: 1 each; Refill: 0  Preterm labor symptoms and general obstetric precautions including but not limited to vaginal bleeding, contractions, leaking of fluid and fetal movement were reviewed in detail with the patient. Please refer to After Visit Summary for other counseling recommendations.   Return in about 4 weeks (around 01/20/2020).  Future Appointments  Date Time Provider Department Center  12/24/2019  8:45 AM [facility] NURSE [facility]  12/24/2019  9:00 AM [facility] US1 [facility]  01/19/2020  9:50 AM [place], Wilmer Floor, CNM [facility] None    Sharen Counter, CNM

## 2019-12-23 NOTE — Patient Instructions (Signed)

## 2019-12-24 ENCOUNTER — Other Ambulatory Visit: Payer: Self-pay | Admitting: *Deleted

## 2019-12-24 ENCOUNTER — Encounter: Payer: Self-pay | Admitting: *Deleted

## 2019-12-24 ENCOUNTER — Ambulatory Visit: Payer: Medicaid Other | Admitting: *Deleted

## 2019-12-24 ENCOUNTER — Ambulatory Visit: Payer: Medicaid Other | Attending: Obstetrics & Gynecology

## 2019-12-24 DIAGNOSIS — Z3402 Encounter for supervision of normal first pregnancy, second trimester: Secondary | ICD-10-CM

## 2019-12-24 DIAGNOSIS — Z3A15 15 weeks gestation of pregnancy: Secondary | ICD-10-CM | POA: Insufficient documentation

## 2019-12-24 DIAGNOSIS — O99210 Obesity complicating pregnancy, unspecified trimester: Secondary | ICD-10-CM | POA: Diagnosis not present

## 2019-12-24 DIAGNOSIS — N1330 Unspecified hydronephrosis: Secondary | ICD-10-CM

## 2020-01-19 ENCOUNTER — Telehealth (INDEPENDENT_AMBULATORY_CARE_PROVIDER_SITE_OTHER): Payer: Medicaid Other | Admitting: Advanced Practice Midwife

## 2020-01-19 DIAGNOSIS — Z3A23 23 weeks gestation of pregnancy: Secondary | ICD-10-CM

## 2020-01-19 DIAGNOSIS — Z3402 Encounter for supervision of normal first pregnancy, second trimester: Secondary | ICD-10-CM

## 2020-01-19 NOTE — Progress Notes (Signed)
Pt does not have BP cuff with her at time of intake.

## 2020-01-19 NOTE — Progress Notes (Signed)
OBSTETRICS PRENATAL VIRTUAL VISIT ENCOUNTER NOTE  Provider location: Center for [facility] [place]   I connected with Sydney Kelley on 01/19/20 at  9:55 AM EST by MyChart Video Encounter at home and verified that I am speaking with the correct person using two identifiers.   I discussed the limitations, risks, security and privacy concerns of performing an evaluation and management service virtually and the availability of in person appointments. I also discussed with the patient that there may be a patient responsible charge related to this service. The patient expressed understanding and agreed to proceed. Subjective:  Sydney Kelley is a 19 y.o. G1P0 at [redacted]w[redacted]d being seen today for ongoing prenatal care.  She is currently monitored for the following issues for this low-risk pregnancy and has Encounter for supervision of normal first pregnancy and LGSIL on Pap smear of cervix on their problem list.  Patient reports no complaints.  Contractions: Not present. Vag. Bleeding: None.  Movement: Present. Denies any leaking of fluid.   The following portions of the patient's history were reviewed and updated as appropriate: allergies, current medications, past family history, past medical history, past social history, past surgical history and problem list.   Objective:  There were no vitals filed for this visit.  Fetal Status:     Movement: Present     General:  Alert, oriented and cooperative. Patient is in no acute distress.  Respiratory: Normal respiratory effort, no problems with respiration noted  Mental Status: Normal mood and affect. Normal behavior. Normal judgment and thought content.  Rest of physical exam deferred due to type of encounter  Imaging: [place] MFM OB DETAIL +14 WK  Result Date: 12/24/2019 ----------------------------------------------------------------------  OBSTETRICS REPORT                       (Signed Final 12/24/2019 11:48 am)  ---------------------------------------------------------------------- Patient Info  ID #:       [id number]                          D.O.B.:  February 19, 2001 (19 yrs)  Name:       Sydney Kelley                   Visit Date: 12/24/2019 08:48 am ---------------------------------------------------------------------- Performed By  Attending:        Ma Rings MD         Ref. Address:     [address]                                                             [phone]  [place]                                                             [address]  Performed By:     Earley Brooke     [facility]  Referred By:      [facility] ---------------------------------------------------------------------- Orders  #  Description                           Code        Ordered By  1  [place] MFM OB DETAIL +14 WK               76811.01    Jaynie Collins ----------------------------------------------------------------------  #  Order #                     Accession #                Episode #  1  [id number]                   [id number]                 [id number] ---------------------------------------------------------------------- Indications  Obesity complicating pregnancy, second         O99.212  trimester (BMI 30.9)  Encounter for antenatal screening for          Z36.3  malformations (LR NIPS, NEG Horizon, NEG  AFP)  [redacted] weeks gestation of pregnancy                Z3A.19  Medical complication of pregnancy (Rubella     O26.90  Non-Immune) ---------------------------------------------------------------------- Fetal Evaluation  Num Of Fetuses:         1  Fetal Heart Rate(bpm):  145   Cardiac Activity:       Observed  Presentation:           Breech  Placenta:               Anterior Fundal  P. Cord Insertion:      Visualized  Amniotic Fluid  AFI FV:      Within  normal limits                              Largest Pocket(cm)                              5.76 ---------------------------------------------------------------------- Biometry  BPD:        50  mm     G. Age:  21w 1d         94  %    CI:        75.63   %    70 - 86                                                          FL/HC:      17.0   %    16.8 - 19.8  HC:      182.3  mm     G. Age:  20w 4d         82  %    HC/AC:      1.13        1.09 - 1.39  AC:      161.6  mm     G. Age:  21w 2d         88  %    FL/BPD:     61.8   %  FL:       30.9  mm     G. Age:  19w 4d         37  %    FL/AC:      19.1   %    20 - 24  HUM:      30.8  mm     G. Age:  20w 2d         65  %  CER:      22.4  mm     G. Age:  21w 0d         95  %  NFT:       4.2  mm  LV:        6.3  mm  CM:        5.8  mm  Est. FW:     359  gm    0 lb 13 oz      87  % ---------------------------------------------------------------------- OB History  Gravidity:    1         Term:   0        Prem:   0        SAB:   0  TOP:          0       Ectopic:  0        Living: 0 ---------------------------------------------------------------------- Gestational Age  LMP:           19w 5d        Date:  08/08/19                 EDD:   05/14/20  U/S Today:     20w 5d  EDD:   05/07/20  Best:          19w 5d     Det. By:  LMP  (08/08/19)          EDD:   05/14/20 ---------------------------------------------------------------------- Anatomy  Cranium:               Appears normal         LVOT:                   Appears normal  Cavum:                 Appears normal         Aortic Arch:            Appears normal  Ventricles:            Appears normal         Ductal Arch:            Appears normal  Choroid Plexus:        Appears normal         Diaphragm:               Appears normal  Cerebellum:            Appears normal         Stomach:                Appears normal, left                                                                        sided  Posterior Fossa:       Appears normal         Abdomen:                Appears normal  Nuchal Fold:           Appears normal         Abdominal Wall:         Appears nml (cord                                                                        insert, abd wall)  Face:                  Appears normal         Cord Vessels:           Appears normal (3                         (orbits and profile)                           vessel cord)  Lips:                  Appears normal         Kidneys:  Left sided                                                                        pyelectasis, 4.1                                                                        mm  Palate:                Appears normal         Bladder:                Appears normal  Thoracic:              Appears normal         Spine:                  Appears normal  Heart:                 Appears normal         Upper Extremities:      Appears normal                         (4CH, axis, and                         situs)  RVOT:                  Appears normal         Lower Extremities:      Appears normal  Other:  Fetus appears to be a female. Heels and 5th digit visualized. Nasal          bone visualized. ---------------------------------------------------------------------- Cervix Uterus Adnexa  Cervix  Length:           4.24  cm.  Normal appearance by transabdominal scan.  Right Ovary  Within normal limits.  Left Ovary  Within normal limits.  Adnexa  No abnormality visualized. ---------------------------------------------------------------------- Comments  This patient was seen for a detailed fetal anatomy scan due  to maternal obesity.  She denies any significant past medical history and denies  any problems in her current pregnancy.  She had a cell free DNA  test earlier in her pregnancy which  indicated a low risk for trisomy 19, 61, and 13. A female fetus is  predicted.  She was informed that the fetal growth and amniotic fluid  level were appropriate for her gestational age.  Mild left pyelectasis measuring  0.4 cm dilated was noted on  today's ultrasound exam.  The implications and management  of pylectasis was discussed with the patient. She was  advised that the pyelectasis noted on prenatal ultrasounds  will often resolve spontaneously after birth.  However, there  may also be other processes such as an obstruction or reflux  causing this finding that may require treatment after birth.  She was advised that we  will continue to follow her closely to  assess this finding.  Should the renal pelvis continue to  enlarge, we will consider a referral to pediatric  urology/nephrology for a prenatal consultation.  The small association between pyelectasis and Down  syndrome was discussed.  She was offered and declined an  amniocentesis today for definitive diagnosis of fetal  aneuploidy.  She was reassured by the low risk for Down  syndrome indicated by her cell free DNA test.  The patient was informed that anomalies may be missed due  to technical limitations. If the fetus is in a suboptimal position  or maternal habitus is increased, visualization of the fetus in  the maternal uterus may be impaired.  A follow-up exam was scheduled in 5 weeks to reassess the  fetal kidneys. ----------------------------------------------------------------------                   Ma Rings, MD Electronically Signed Final Report   12/24/2019 11:48 am ----------------------------------------------------------------------   Assessment and Plan:  Pregnancy: G1P0 at [redacted]w[redacted]d 1. Encounter for supervision of normal first pregnancy in second trimester --Anticipatory guidance about next visits/weeks of pregnancy given. --Next visit in 4 weeks for 28 week labs, TDAP, flu vaccine if not already  completed --Pt without BP cuff today, she is not at home. Will enter into Babys[employer]s or call office with BP today.  No s/sx of PEC today.  Preterm labor symptoms and general obstetric precautions including but not limited to vaginal bleeding, contractions, leaking of fluid and fetal movement were reviewed in detail with the patient. I discussed the assessment and treatment plan with the patient. The patient was provided an opportunity to ask questions and all were answered. The patient agreed with the plan and demonstrated an understanding of the instructions. The patient was advised to call back or seek an in-person office evaluation/go to [facility]U at [facility] for any urgent or concerning symptoms. Please refer to After Visit Summary for other counseling recommendations.   I provided 7 minutes of face-to-face time during this encounter.  Return in about 4 weeks (around 02/16/2020).  Future Appointments  Date Time Provider Department Center  01/28/2020  9:30 AM [facility] NURSE [facility]  01/28/2020  9:45 AM [facility] US5 [facility]    Sharen Counter, CNM Center for [employer], [facility]

## 2020-01-28 ENCOUNTER — Other Ambulatory Visit: Payer: Self-pay

## 2020-01-28 ENCOUNTER — Encounter: Payer: Self-pay | Admitting: *Deleted

## 2020-01-28 ENCOUNTER — Other Ambulatory Visit: Payer: Self-pay | Admitting: *Deleted

## 2020-01-28 ENCOUNTER — Ambulatory Visit: Payer: Medicaid Other | Attending: Obstetrics

## 2020-01-28 ENCOUNTER — Ambulatory Visit: Payer: Medicaid Other | Admitting: *Deleted

## 2020-01-28 DIAGNOSIS — N1330 Unspecified hydronephrosis: Secondary | ICD-10-CM

## 2020-01-28 DIAGNOSIS — Z3A24 24 weeks gestation of pregnancy: Secondary | ICD-10-CM

## 2020-01-28 DIAGNOSIS — E669 Obesity, unspecified: Secondary | ICD-10-CM | POA: Diagnosis not present

## 2020-01-28 DIAGNOSIS — O99212 Obesity complicating pregnancy, second trimester: Secondary | ICD-10-CM

## 2020-01-28 DIAGNOSIS — Z283 Underimmunization status: Secondary | ICD-10-CM

## 2020-01-28 DIAGNOSIS — Z3402 Encounter for supervision of normal first pregnancy, second trimester: Secondary | ICD-10-CM

## 2020-01-28 DIAGNOSIS — Z363 Encounter for antenatal screening for malformations: Secondary | ICD-10-CM

## 2020-02-16 ENCOUNTER — Other Ambulatory Visit: Payer: Self-pay

## 2020-02-16 ENCOUNTER — Encounter: Payer: Self-pay | Admitting: Advanced Practice Midwife

## 2020-02-16 ENCOUNTER — Other Ambulatory Visit: Payer: Medicaid Other

## 2020-02-16 ENCOUNTER — Ambulatory Visit (INDEPENDENT_AMBULATORY_CARE_PROVIDER_SITE_OTHER): Payer: Medicaid Other | Admitting: Advanced Practice Midwife

## 2020-02-16 VITALS — Wt 234.0 lb

## 2020-02-16 DIAGNOSIS — Z3480 Encounter for supervision of other normal pregnancy, unspecified trimester: Secondary | ICD-10-CM

## 2020-02-16 DIAGNOSIS — Z3402 Encounter for supervision of normal first pregnancy, second trimester: Secondary | ICD-10-CM

## 2020-02-16 NOTE — Patient Instructions (Signed)
Third Trimester of Pregnancy The third trimester is from week 28 through week 40 (months 7 through 9). The third trimester is a time when the unborn baby (fetus) is growing rapidly. At the end of the ninth month, the fetus is about 20 inches in length and weighs 6-10 pounds. Body changes during your third trimester Your body will continue to go through many changes during pregnancy. The changes vary from woman to woman. During the third trimester:  Your weight will continue to increase. You can expect to gain 25-35 pounds (11-16 kg) by the end of the pregnancy.  You may begin to get stretch marks on your hips, abdomen, and breasts.  You may urinate more often because the fetus is moving lower into your pelvis and pressing on your bladder.  You may develop or continue to have heartburn. This is caused by increased hormones that slow down muscles in the digestive tract.  You may develop or continue to have constipation because increased hormones slow digestion and cause the muscles that push waste through your intestines to relax.  You may develop hemorrhoids. These are swollen veins (varicose veins) in the rectum that can itch or be painful.  You may develop swollen, bulging veins (varicose veins) in your legs.  You may have increased body aches in the pelvis, back, or thighs. This is due to weight gain and increased hormones that are relaxing your joints.  You may have changes in your hair. These can include thickening of your hair, rapid growth, and changes in texture. Some women also have hair loss during or after pregnancy, or hair that feels dry or thin. Your hair will most likely return to normal after your baby is born.  Your breasts will continue to grow and they will continue to become tender. A yellow fluid (colostrum) may leak from your breasts. This is the first milk you are producing for your baby.  Your belly button may stick out.  You may notice more swelling in your hands,  face, or ankles.  You may have increased tingling or numbness in your hands, arms, and legs. The skin on your belly may also feel numb.  You may feel short of breath because of your expanding uterus.  You may have more problems sleeping. This can be caused by the size of your belly, increased need to urinate, and an increase in your body's metabolism.  You may notice the fetus "dropping," or moving lower in your abdomen (lightening).  You may have increased vaginal discharge.  You may notice your joints feel loose and you may have pain around your pelvic bone. What to expect at prenatal visits You will have prenatal exams every 2 weeks until week 36. Then you will have weekly prenatal exams. During a routine prenatal visit:  You will be weighed to make sure you and the baby are growing normally.  Your blood pressure will be taken.  Your abdomen will be measured to track your baby's growth.  The fetal heartbeat will be listened to.  Any test results from the previous visit will be discussed.  You may have a cervical check near your due date to see if your cervix has softened or thinned (effaced).  You will be tested for Group B streptococcus. This happens between 35 and 37 weeks. Your health care provider may ask you:  What your birth plan is.  How you are feeling.  If you are feeling the baby move.  If you have had any abnormal   symptoms, such as leaking fluid, bleeding, severe headaches, or abdominal cramping.  If you are using any tobacco products, including cigarettes, chewing tobacco, and electronic cigarettes.  If you have any questions. Other tests or screenings that may be performed during your third trimester include:  Blood tests that check for low iron levels (anemia).  Fetal testing to check the health, activity level, and growth of the fetus. Testing is done if you have certain medical conditions or if there are problems during the pregnancy.  Nonstress test  (NST). This test checks the health of your baby to make sure there are no signs of problems, such as the baby not getting enough oxygen. During this test, a belt is placed around your belly. The baby is made to move, and its heart rate is monitored during movement. What is false labor? False labor is a condition in which you feel small, irregular tightenings of the muscles in the womb (contractions) that usually go away with rest, changing position, or drinking water. These are called Braxton Hicks contractions. Contractions may last for hours, days, or even weeks before true labor sets in. If contractions come at regular intervals, become more frequent, increase in intensity, or become painful, you should see your health care provider. What are the signs of labor?  Abdominal cramps.  Regular contractions that start at 10 minutes apart and become stronger and more frequent with time.  Contractions that start on the top of the uterus and spread down to the lower abdomen and back.  Increased pelvic pressure and dull back pain.  A watery or bloody mucus discharge that comes from the vagina.  Leaking of amniotic fluid. This is also known as your "water breaking." It could be a slow trickle or a gush. Let your health care provider know if it has a color or strange odor. If you have any of these signs, call your health care provider right away, even if it is before your due date. Follow these instructions at home: Medicines  Follow your health care provider's instructions regarding medicine use. Specific medicines may be either safe or unsafe to take during pregnancy.  Take a prenatal vitamin that contains at least 600 micrograms (mcg) of folic acid.  If you develop constipation, try taking a stool softener if your health care provider approves. Eating and drinking   Eat a balanced diet that includes fresh fruits and vegetables, whole grains, good sources of protein such as meat, eggs, or tofu,  and low-fat dairy. Your health care provider will help you determine the amount of weight gain that is right for you.  Avoid raw meat and uncooked cheese. These carry germs that can cause birth defects in the baby.  If you have low calcium intake from food, talk to your health care provider about whether you should take a daily calcium supplement.  Eat four or five small meals rather than three large meals a day.  Limit foods that are high in fat and processed sugars, such as fried and sweet foods.  To prevent constipation: ? Drink enough fluid to keep your urine clear or pale yellow. ? Eat foods that are high in fiber, such as fresh fruits and vegetables, whole grains, and beans. Activity  Exercise only as directed by your health care provider. Most women can continue their usual exercise routine during pregnancy. Try to exercise for 30 minutes at least 5 days a week. Stop exercising if you experience uterine contractions.  Avoid heavy lifting.  Do   not exercise in extreme heat or humidity, or at high altitudes.  Wear low-heel, comfortable shoes.  Practice good posture.  You may continue to have sex unless your health care provider tells you otherwise. Relieving pain and discomfort  Take frequent breaks and rest with your legs elevated if you have leg cramps or low back pain.  Take warm sitz baths to soothe any pain or discomfort caused by hemorrhoids. Use hemorrhoid cream if your health care provider approves.  Wear a good support bra to prevent discomfort from breast tenderness.  If you develop varicose veins: ? Wear support pantyhose or compression stockings as told by your healthcare provider. ? Elevate your feet for 15 minutes, 3-4 times a day. Prenatal care  Write down your questions. Take them to your prenatal visits.  Keep all your prenatal visits as told by your health care provider. This is important. Safety  Wear your seat belt at all times when driving.  Make  a list of emergency phone numbers, including numbers for family, friends, the hospital, and police and fire departments. General instructions  Avoid cat litter boxes and soil used by cats. These carry germs that can cause birth defects in the baby. If you have a cat, ask someone to clean the litter box for you.  Do not travel far distances unless it is absolutely necessary and only with the approval of your health care provider.  Do not use hot tubs, steam rooms, or saunas.  Do not drink alcohol.  Do not use any products that contain nicotine or tobacco, such as cigarettes and e-cigarettes. If you need help quitting, ask your health care provider.  Do not use any medicinal herbs or unprescribed drugs. These chemicals affect the formation and growth of the baby.  Do not douche or use tampons or scented sanitary pads.  Do not cross your legs for long periods of time.  To prepare for the arrival of your baby: ? Take prenatal classes to understand, practice, and ask questions about labor and delivery. ? Make a trial run to the hospital. ? Visit the hospital and tour the maternity area. ? Arrange for maternity or paternity leave through employers. ? Arrange for family and friends to take care of pets while you are in the hospital. ? Purchase a rear-facing car seat and make sure you know how to install it in your car. ? Pack your hospital bag. ? Prepare the baby's nursery. Make sure to remove all pillows and stuffed animals from the baby's crib to prevent suffocation.  Visit your dentist if you have not gone during your pregnancy. Use a soft toothbrush to brush your teeth and be gentle when you floss. Contact a health care provider if:  You are unsure if you are in labor or if your water has broken.  You become dizzy.  You have mild pelvic cramps, pelvic pressure, or nagging pain in your abdominal area.  You have lower back pain.  You have persistent nausea, vomiting, or  diarrhea.  You have an unusual or bad smelling vaginal discharge.  You have pain when you urinate. Get help right away if:  Your water breaks before 37 weeks.  You have regular contractions less than 5 minutes apart before 37 weeks.  You have a fever.  You are leaking fluid from your vagina.  You have spotting or bleeding from your vagina.  You have severe abdominal pain or cramping.  You have rapid weight loss or weight gain.  You have   shortness of breath with chest pain.  You notice sudden or extreme swelling of your face, hands, ankles, feet, or legs.  Your baby makes fewer than 10 movements in 2 hours.  You have severe headaches that do not go away when you take medicine.  You have vision changes. Summary  The third trimester is from week 28 through week 40, months 7 through 9. The third trimester is a time when the unborn baby (fetus) is growing rapidly.  During the third trimester, your discomfort may increase as you and your baby continue to gain weight. You may have abdominal, leg, and back pain, sleeping problems, and an increased need to urinate.  During the third trimester your breasts will keep growing and they will continue to become tender. A yellow fluid (colostrum) may leak from your breasts. This is the first milk you are producing for your baby.  False labor is a condition in which you feel small, irregular tightenings of the muscles in the womb (contractions) that eventually go away. These are called Braxton Hicks contractions. Contractions may last for hours, days, or even weeks before true labor sets in.  Signs of labor can include: abdominal cramps; regular contractions that start at 10 minutes apart and become stronger and more frequent with time; watery or bloody mucus discharge that comes from the vagina; increased pelvic pressure and dull back pain; and leaking of amniotic fluid. This information is not intended to replace advice given to you by your  health care provider. Make sure you discuss any questions you have with your health care provider. Document Revised: 06/13/2018 Document Reviewed: 03/28/2016 Elsevier Patient Education  2020 [employer]  

## 2020-02-16 NOTE — Progress Notes (Signed)
   PRENATAL VISIT NOTE  Subjective:  Sydney Kelley is a 19 y.o. G1P0 at [redacted]w[redacted]d being seen today for ongoing prenatal care.  She is currently monitored for the following issues for this low-risk pregnancy and has Encounter for supervision of normal first pregnancy and LGSIL on Pap smear of cervix on their problem list.  Patient reports no complaints.  Contractions: Not present. Vag. Bleeding: None.  Movement: Present. Denies leaking of fluid.   The following portions of the patient's history were reviewed and updated as appropriate: allergies, current medications, past family history, past medical history, past social history, past surgical history and problem list.   Objective:   Vitals:   02/16/20 0908  Weight: 234 lb (106.1 kg)    Fetal Status: Fetal Heart Rate (bpm): 143 Fundal Height: 28 cm Movement: Present     General:  Alert, oriented and cooperative. Patient is in no acute distress.  Skin: Skin is warm and dry. No rash noted.   Cardiovascular: Normal heart rate noted  Respiratory: Normal respiratory effort, no problems with respiration noted  Abdomen: Soft, gravid, appropriate for gestational age.  Pain/Pressure: Absent     Pelvic: Cervical exam deferred        Extremities: Normal range of motion.  Edema: None  Mental Status: Normal mood and affect. Normal behavior. Normal judgment and thought content.   Assessment and Plan:  Pregnancy: G1P0 at [redacted]w[redacted]d 1. Supervision of other normal pregnancy, antepartum --Anticipatory guidance about next visits/weeks of pregnancy given. --Next visit in 4 weeks in office, pt to enter BP in [employer] weekly    Preterm labor symptoms and general obstetric precautions including but not limited to vaginal bleeding, contractions, leaking of fluid and fetal movement were reviewed in detail with the patient. Please refer to After Visit Summary for other counseling recommendations.   Return in about 4 weeks (around 03/15/2020).  Future  Appointments  Date Time Provider Department Center  02/16/2020  9:55 AM [facility], Wilmer Floor, CNM [facility] None  03/22/2020  9:45 AM [facility] NURSE [facility]  03/22/2020 10:00 AM [facility] US1 [facility]    Sharen Counter, CNM

## 2020-02-16 NOTE — Progress Notes (Signed)
ROB   2 hr GTT   CC: None

## 2020-02-17 ENCOUNTER — Other Ambulatory Visit: Payer: Self-pay | Admitting: Advanced Practice Midwife

## 2020-02-17 DIAGNOSIS — O99013 Anemia complicating pregnancy, third trimester: Secondary | ICD-10-CM

## 2020-02-17 LAB — CBC
Hematocrit: 30.6 % — ABNORMAL LOW (ref 34.0–46.6)
Hemoglobin: 10.1 g/dL — ABNORMAL LOW (ref 11.1–15.9)
MCH: 26.9 pg (ref 26.6–33.0)
MCHC: 33 g/dL (ref 31.5–35.7)
MCV: 82 fL (ref 79–97)
Platelets: 268 10*3/uL (ref 150–450)
RBC: 3.75 x10E6/uL — ABNORMAL LOW (ref 3.77–5.28)
RDW: 13.4 % (ref 11.7–15.4)
WBC: 12.4 10*3/uL — ABNORMAL HIGH (ref 3.4–10.8)

## 2020-02-17 LAB — RPR: RPR Ser Ql: NONREACTIVE

## 2020-02-17 LAB — GLUCOSE TOLERANCE, 2 HOURS W/ 1HR
Glucose, 1 hour: 160 mg/dL (ref 65–179)
Glucose, 2 hour: 106 mg/dL (ref 65–152)
Glucose, Fasting: 90 mg/dL (ref 65–91)

## 2020-02-17 LAB — HIV ANTIBODY (ROUTINE TESTING W REFLEX): HIV Screen 4th Generation wRfx: NONREACTIVE

## 2020-02-17 MED ORDER — FERROUS SULFATE 325 (65 FE) MG PO TABS: 325.0000 mg | ORAL_TABLET | ORAL | 3 refills | Status: DC

## 2020-02-17 NOTE — Progress Notes (Signed)
fer

## 2020-03-06 NOTE — L&D Delivery Note (Addendum)
OB/GYN Faculty Practice Delivery Note  Sydney Kelley is a 20 y.o. G1P0 s/p VD at [redacted]w[redacted]d. She was admitted for IOL for gHTN.   ROM: 12h 43m with clear fluid GBS Status: Negative Maximum Maternal Temperature: 100.56F  Labor Progress: Presented for IOL secondary to gHTN. Given unfavorable cervix at time of admission, cytotec x2 was administered and foley bulb was placed for cervical ripening. Pt subsequently started on pitocin.  Had SROM with clear fluid.  IUPC placed.  Pt progressed to complete cervical dilation and then delivered without further complication.  Delivery Date/Time: 05/04/2020 at 2344 Delivery: Called to room and patient was complete and pushing. Head delivered LOA. No nuchal cord present. Shoulder and body delivered in usual fashion. Infant with spontaneous cry, placed on mother's abdomen, dried and stimulated. Cord clamped x 2 after 1-minute delay, and cut by FOB under my direct supervision. Cord blood drawn. Placenta delivered spontaneously with gentle cord traction. Fundus firm with massage and Pitocin. Labia, perineum, vagina, and cervix were inspected, found to have 2nd degree perineal and right labial lacerations.   Placenta: Intact, 3 vessel cord Complications: None Lacerations: 2nd perineal laceration and right labial that were repaired with 3-0 Vicryl Rapide in standard fashion with hemostasis noted after completion of repair EBL: 150 mL Analgesia: Epidural  Infant: Viable female  APGARs 8 & 9  weight per medical record  EMILY Genene Churn, MD 05/05/2020, 12:18 AM  I was gloved and present for delivery infant and placenta as noted above. I assisted with laceration repair as noted.  Sheila Oats, MD OB Fellow, Faculty Practice 05/05/2020 1:10 AM

## 2020-03-15 ENCOUNTER — Ambulatory Visit (INDEPENDENT_AMBULATORY_CARE_PROVIDER_SITE_OTHER): Payer: Medicaid Other | Admitting: Advanced Practice Midwife

## 2020-03-15 ENCOUNTER — Other Ambulatory Visit: Payer: Self-pay

## 2020-03-15 VITALS — BP 113/68 | HR 89 | Wt 239.0 lb

## 2020-03-15 DIAGNOSIS — Z3A31 31 weeks gestation of pregnancy: Secondary | ICD-10-CM

## 2020-03-15 DIAGNOSIS — Z3009 Encounter for other general counseling and advice on contraception: Secondary | ICD-10-CM

## 2020-03-15 DIAGNOSIS — Z3480 Encounter for supervision of other normal pregnancy, unspecified trimester: Secondary | ICD-10-CM

## 2020-03-15 NOTE — Progress Notes (Signed)
   PRENATAL VISIT NOTE  Subjective:  Kalasia Crafton is a 20 y.o. G1P0 at [redacted]w[redacted]d being seen today for ongoing prenatal care.  She is currently monitored for the following issues for this low-risk pregnancy and has Encounter for supervision of normal first pregnancy and LGSIL on Pap smear of cervix on their problem list.  Patient reports no complaints.  Contractions: Not present. Vag. Bleeding: None.  Movement: Present. Denies leaking of fluid.   The following portions of the patient's history were reviewed and updated as appropriate: allergies, current medications, past family history, past medical history, past social history, past surgical history and problem list.   Objective:   Vitals:   03/15/20 0919  BP: 113/68  Pulse: 89  Weight: 108.4 kg    Fetal Status: Fetal Heart Rate (bpm): 147   Movement: Present     General:  Alert, oriented and cooperative. Patient is in no acute distress.  Skin: Skin is warm and dry. No rash noted.   Cardiovascular: Normal heart rate noted  Respiratory: Normal respiratory effort, no problems with respiration noted  Abdomen: Soft, gravid, appropriate for gestational age.  Pain/Pressure: Absent     Pelvic: Cervical exam deferred        Extremities: Normal range of motion.  Edema: None  Mental Status: Normal mood and affect. Normal behavior. Normal judgment and thought content.   Assessment and Plan:  Pregnancy: G1P0 at [redacted]w[redacted]d 1. [redacted] weeks gestation of pregnancy - Anticipatory guidance of GBS & GC swab at next visit  2. Supervision of other normal pregnancy, antepartum   3. Encounter for counseling regarding contraception Education given regarding options for contraception, including barrier methods, injectable contraception, IUD placement, oral contraceptives. Contraception methods presented in a tiered fashion. Patient encouraged to visit [email or web address]   Preterm labor symptoms and general obstetric precautions including but not limited to  vaginal bleeding, contractions, leaking of fluid and fetal movement were reviewed in detail with the patient. Please refer to After Visit Summary for other counseling recommendations.   No follow-ups on file.  Future Appointments  Date Time Provider Department Center  03/22/2020  9:45 AM [facility] NURSE [facility]  03/22/2020 10:00 AM [facility] US1 [facility]    Sande Rives, Student-MidWife

## 2020-03-15 NOTE — Patient Instructions (Signed)
For Birth Control choices visit [email or web address]

## 2020-03-15 NOTE — Progress Notes (Signed)
ROB, reports no problems today. 

## 2020-03-22 ENCOUNTER — Ambulatory Visit: Payer: Medicaid Other

## 2020-03-26 ENCOUNTER — Other Ambulatory Visit: Payer: Self-pay

## 2020-03-26 ENCOUNTER — Ambulatory Visit (HOSPITAL_BASED_OUTPATIENT_CLINIC_OR_DEPARTMENT_OTHER): Payer: Medicaid Other

## 2020-03-26 ENCOUNTER — Ambulatory Visit: Payer: Medicaid Other | Attending: Obstetrics and Gynecology | Admitting: *Deleted

## 2020-03-26 ENCOUNTER — Encounter: Payer: Self-pay | Admitting: *Deleted

## 2020-03-26 ENCOUNTER — Other Ambulatory Visit: Payer: Self-pay | Admitting: *Deleted

## 2020-03-26 VITALS — BP 122/66 | HR 79

## 2020-03-26 DIAGNOSIS — O2693 Pregnancy related conditions, unspecified, third trimester: Secondary | ICD-10-CM | POA: Diagnosis not present

## 2020-03-26 DIAGNOSIS — O99213 Obesity complicating pregnancy, third trimester: Secondary | ICD-10-CM | POA: Insufficient documentation

## 2020-03-26 DIAGNOSIS — O283 Abnormal ultrasonic finding on antenatal screening of mother: Secondary | ICD-10-CM

## 2020-03-26 DIAGNOSIS — E669 Obesity, unspecified: Secondary | ICD-10-CM

## 2020-03-26 DIAGNOSIS — O09893 Supervision of other high risk pregnancies, third trimester: Secondary | ICD-10-CM | POA: Diagnosis not present

## 2020-03-26 DIAGNOSIS — O368930 Maternal care for other specified fetal problems, third trimester, not applicable or unspecified: Secondary | ICD-10-CM | POA: Diagnosis not present

## 2020-03-26 DIAGNOSIS — Z3A33 33 weeks gestation of pregnancy: Secondary | ICD-10-CM | POA: Insufficient documentation

## 2020-03-26 DIAGNOSIS — N1330 Unspecified hydronephrosis: Secondary | ICD-10-CM | POA: Diagnosis present

## 2020-03-26 DIAGNOSIS — O99212 Obesity complicating pregnancy, second trimester: Secondary | ICD-10-CM | POA: Diagnosis not present

## 2020-03-26 DIAGNOSIS — O3663X0 Maternal care for excessive fetal growth, third trimester, not applicable or unspecified: Secondary | ICD-10-CM | POA: Diagnosis not present

## 2020-03-26 DIAGNOSIS — R638 Other symptoms and signs concerning food and fluid intake: Secondary | ICD-10-CM

## 2020-04-10 ENCOUNTER — Inpatient Hospital Stay (HOSPITAL_COMMUNITY)
Admission: AD | Admit: 2020-04-10 | Discharge: 2020-04-10 | Disposition: A | Discharge status: Home / Self Care | Payer: Medicaid Other | Attending: Obstetrics and Gynecology | Admitting: Obstetrics and Gynecology

## 2020-04-10 ENCOUNTER — Other Ambulatory Visit: Payer: Self-pay

## 2020-04-10 ENCOUNTER — Encounter (HOSPITAL_COMMUNITY): Payer: Self-pay | Admitting: Obstetrics and Gynecology

## 2020-04-10 DIAGNOSIS — Z3A35 35 weeks gestation of pregnancy: Secondary | ICD-10-CM | POA: Insufficient documentation

## 2020-04-10 DIAGNOSIS — R030 Elevated blood-pressure reading, without diagnosis of hypertension: Secondary | ICD-10-CM

## 2020-04-10 DIAGNOSIS — O26893 Other specified pregnancy related conditions, third trimester: Secondary | ICD-10-CM | POA: Diagnosis not present

## 2020-04-10 DIAGNOSIS — R519 Headache, unspecified: Secondary | ICD-10-CM | POA: Diagnosis not present

## 2020-04-10 DIAGNOSIS — R102 Pelvic and perineal pain: Secondary | ICD-10-CM | POA: Diagnosis not present

## 2020-04-10 DIAGNOSIS — R109 Unspecified abdominal pain: Secondary | ICD-10-CM | POA: Diagnosis not present

## 2020-04-10 DIAGNOSIS — Z3689 Encounter for other specified antenatal screening: Secondary | ICD-10-CM

## 2020-04-10 LAB — URINALYSIS, ROUTINE W REFLEX MICROSCOPIC
Bilirubin Urine: NEGATIVE
Glucose, UA: NEGATIVE mg/dL
Hgb urine dipstick: NEGATIVE
Ketones, ur: NEGATIVE mg/dL
Leukocytes,Ua: NEGATIVE
Nitrite: NEGATIVE
Protein, ur: NEGATIVE mg/dL
Specific Gravity, Urine: 1.005 (ref 1.005–1.030)
pH: 7 (ref 5.0–8.0)

## 2020-04-10 NOTE — MAU Note (Signed)
Patient reports to MAU with lower abdominal and pelvic pain that began a couple of days ago.  Rates pain 6/10.  Took bp at home before she came to MAU and it was 140s/90s.  Has a headache that began today and some swelling in her feet that has been ongoing for a couple of days.  +FM.  Denies vaginal bleeding or leaking of fluids.

## 2020-04-10 NOTE — MAU Provider Note (Addendum)
History     CSN: [id number]  Arrival date and time: 04/10/20 2055   Event Date/Time   First Provider Initiated Contact with Patient 04/10/20 2206      Chief Complaint  Patient presents with   Abdominal Pain   Hypertension   Headache   Pelvic Pain   Hypertension This is a new problem. The current episode started today. The problem has been rapidly improving since onset. Associated symptoms include headaches. Pertinent negatives include no chest pain or shortness of breath.  Headache  This is a new problem. The current episode started today. The problem occurs intermittently. The problem has been gradually improving. The pain is located in the parietal region. The pain does not radiate. The quality of the pain is described as aching. The pain is at a severity of 2/10. The pain is mild. Associated symptoms include back pain (lower mid back). Pertinent negatives include no coughing or dizziness. Her past medical history is significant for hypertension.  Pelvic Pain The patient's primary symptoms include pelvic pain. This is a new problem. The current episode started in the past 7 days. The problem occurs intermittently. The pain is moderate. She is pregnant. Associated symptoms include back pain (lower mid back) and headaches. The symptoms are aggravated by activity. She has tried nothing for the symptoms. She uses nothing for contraception.    Pertinent Gynecological History: Contraception: none Last pap: abnormal: LSIL Date:10/28/2019   Past Medical History:  Diagnosis Date   Medical history non-contributory     Past Surgical History:  Procedure Laterality Date   NO PAST SURGERIES      Family History  Problem Relation Age of Onset   Diabetes Father    Diabetes Paternal Uncle     Social History   Tobacco Use   Smoking status: Never Smoker   Smokeless tobacco: Never Used  Vaping Use   Vaping Use: Never used  Substance Use Topics   Alcohol use: Never    Drug use: Never    Allergies: No Known Allergies  Medications Prior to Admission  Medication Sig Dispense Refill Last Dose   Prenatal Vit-Fe Fumarate-FA (PRENATAL COMPLETE) 14-0.4 MG TABS Take 1 tablet by mouth daily. 60 tablet 0 Past Week at Unknown time   acetaminophen (TYLENOL) 500 MG tablet Take 1,000 mg by mouth every 6 (six) hours as needed. (Patient not taking: No sig reported)      aspirin EC 81 MG tablet Take 1 tablet (81 mg total) by mouth daily. Swallow whole. (Patient not taking: No sig reported) 30 tablet 11    cetirizine (ZYRTEC ALLERGY) 10 MG tablet Take 1 tablet (10 mg total) by mouth daily. (Patient not taking: No sig reported) 30 tablet 0    Doxylamine-Pyridoxine (DICLEGIS) 10-10 MG TBEC Take 2 tabs at bedtime. If needed, add another tab in the morning. If needed, add another tab in the afternoon, up to 4 tabs/day. (Patient not taking: No sig reported) 100 tablet 5    Elastic Bandages & Supports (COMFORT FIT MATERNITY SUPP MED) MISC 1 Device by Does not apply route daily. 1 each 0    ferrous sulfate (FERROUSUL) 325 (65 FE) MG tablet Take 1 tablet (325 mg total) by mouth every other day. 30 tablet 3     Review of Systems  Eyes: Negative for visual disturbance.  Respiratory: Negative for cough and shortness of breath.   Cardiovascular: Negative for chest pain.  Genitourinary: Positive for pelvic pain.  Musculoskeletal: Positive for back pain (lower mid  back).  Neurological: Positive for headaches. Negative for dizziness.   Physical Exam   Blood pressure 121/63, pulse 92, temperature 97.9 F (36.6 C), temperature source Oral, resp. rate 16, height 5\' 4"  (1.626 m), weight 115.6 kg, last menstrual period 08/08/2019, SpO2 100 %.   FHR 135 +accelerations, no decelerations, no contractions. Cat. I    Physical Exam HENT:     Head: Normocephalic.  Cardiovascular:     Rate and Rhythm: Normal rate and regular rhythm.     Heart sounds: Normal heart sounds.   Pulmonary:     Effort: Pulmonary effort is normal.     Breath sounds: Normal breath sounds.  Abdominal:     General: Bowel sounds are normal.  Musculoskeletal:        General: Normal range of motion.     Cervical back: Normal range of motion.  Skin:    General: Skin is warm and dry.  Neurological:     General: No focal deficit present.     Mental Status: She is alert and oriented to person, place, and time.     MAU Course  Procedures  MDM- Education, FHT, discussed Iron supplement   Assessment and Plan  Supervision of normal first pregnancy Elevated BP at home [redacted]w[redacted]d gestation  Monitor blood pressures and FHT Anticipatory guidance regarding visit.  Anticipate discharge if headaches continue to improve and blood pressures stay wnl. Reviewed red flag symptoms and when to call.  [redacted]w[redacted]d  [employer] 04/10/2020, 10:06 PM   Attestation of Supervision of Student:  I confirm that I have verified the information documented in the nurse midwife students note and that I have also personally reperformed the history, physical exam and all medical decision making activities.  I have verified that all services and findings are accurately documented in this student's note; and I agree with management and plan as outlined in the documentation. I have also made any necessary editorial changes.  HPI: Patient reports pelvic cramping with walking and improved with urination and bending over.  She rates the pain a 7-8/10.  Patient states she does not use a maternity belt.  She also reports she had a HA earlier, but none currently.  Patient states she does not drink water daily and prefers sweet tea, soda, and juice.  Patient also reports she "eats a lot of ice."   Patient endorses fetal movement and denies abdominal cramping or contractions.   -Discussed expectation of some pelvic pain during this stage of pregnancy.  -Reviewed UA which was negative. -Discussed usage of  maternity belt which can improve discomfort. -Encouraged increased consumption of water throughout the day. -Instructed to take iron supplement qod. -Instructed to keep next appt as scheduled.  -Encouraged to call or return to MAU if symptoms worsen or with the onset of new symptoms. -Discharged to home in stable condition.   04-25-2000, CNM [facility], [facility] 04/11/2020 12:19 AM

## 2020-04-10 NOTE — Discharge Instructions (Signed)

## 2020-04-15 ENCOUNTER — Other Ambulatory Visit: Payer: Self-pay

## 2020-04-15 ENCOUNTER — Inpatient Hospital Stay (HOSPITAL_COMMUNITY)
Admission: AD | Admit: 2020-04-15 | Discharge: 2020-04-15 | Disposition: A | Discharge status: Home / Self Care | Payer: Medicaid Other | Attending: Family Medicine | Admitting: Family Medicine

## 2020-04-15 ENCOUNTER — Encounter (HOSPITAL_COMMUNITY): Payer: Self-pay | Admitting: Family Medicine

## 2020-04-15 DIAGNOSIS — R519 Headache, unspecified: Secondary | ICD-10-CM | POA: Diagnosis not present

## 2020-04-15 DIAGNOSIS — Z3A35 35 weeks gestation of pregnancy: Secondary | ICD-10-CM | POA: Diagnosis not present

## 2020-04-15 DIAGNOSIS — Z3689 Encounter for other specified antenatal screening: Secondary | ICD-10-CM

## 2020-04-15 DIAGNOSIS — O99891 Other specified diseases and conditions complicating pregnancy: Secondary | ICD-10-CM | POA: Diagnosis not present

## 2020-04-15 DIAGNOSIS — O26893 Other specified pregnancy related conditions, third trimester: Secondary | ICD-10-CM | POA: Insufficient documentation

## 2020-04-15 DIAGNOSIS — R030 Elevated blood-pressure reading, without diagnosis of hypertension: Secondary | ICD-10-CM | POA: Insufficient documentation

## 2020-04-15 DIAGNOSIS — R102 Pelvic and perineal pain: Secondary | ICD-10-CM | POA: Insufficient documentation

## 2020-04-15 LAB — PROTEIN / CREATININE RATIO, URINE
Creatinine, Urine: 49.58 mg/dL
Protein Creatinine Ratio: 0.16 mg/mg{Cre} — ABNORMAL HIGH (ref 0.00–0.15)
Total Protein, Urine: 8 mg/dL

## 2020-04-15 LAB — COMPREHENSIVE METABOLIC PANEL
ALT: 13 U/L (ref 0–44)
AST: 16 U/L (ref 15–41)
Albumin: 2.7 g/dL — ABNORMAL LOW (ref 3.5–5.0)
Alkaline Phosphatase: 162 U/L — ABNORMAL HIGH (ref 38–126)
Anion gap: 10 (ref 5–15)
BUN: 5 mg/dL — ABNORMAL LOW (ref 6–20)
CO2: 20 mmol/L — ABNORMAL LOW (ref 22–32)
Calcium: 8.6 mg/dL — ABNORMAL LOW (ref 8.9–10.3)
Chloride: 108 mmol/L (ref 98–111)
Creatinine, Ser: 0.55 mg/dL (ref 0.44–1.00)
GFR, Estimated: >60 mL/min (ref >60)
Glucose, Bld: 117 mg/dL — ABNORMAL HIGH (ref 70–99)
Potassium: 3.9 mmol/L (ref 3.5–5.1)
Sodium: 138 mmol/L (ref 135–145)
Total Bilirubin: 0.2 mg/dL — ABNORMAL LOW (ref 0.3–1.2)
Total Protein: 6.2 g/dL — ABNORMAL LOW (ref 6.5–8.1)

## 2020-04-15 LAB — URINALYSIS, ROUTINE W REFLEX MICROSCOPIC
Bilirubin Urine: NEGATIVE
Glucose, UA: NEGATIVE mg/dL
Hgb urine dipstick: NEGATIVE
Ketones, ur: NEGATIVE mg/dL
Leukocytes,Ua: NEGATIVE
Nitrite: NEGATIVE
Protein, ur: NEGATIVE mg/dL
Specific Gravity, Urine: 1.01 (ref 1.005–1.030)
pH: 7 (ref 5.0–8.0)

## 2020-04-15 LAB — CBC
HCT: 31.6 % — ABNORMAL LOW (ref 36.0–46.0)
Hemoglobin: 9.8 g/dL — ABNORMAL LOW (ref 12.0–15.0)
MCH: 24.3 pg — ABNORMAL LOW (ref 26.0–34.0)
MCHC: 31 g/dL (ref 30.0–36.0)
MCV: 78.4 fL — ABNORMAL LOW (ref 80.0–100.0)
Platelets: 234 10*3/uL (ref 150–400)
RBC: 4.03 MIL/uL (ref 3.87–5.11)
RDW: 15.6 % — ABNORMAL HIGH (ref 11.5–15.5)
WBC: 10.7 10*3/uL — ABNORMAL HIGH (ref 4.0–10.5)
nRBC: 0 % (ref 0.0–0.2)

## 2020-04-15 MED ORDER — ACETAMINOPHEN 500 MG PO TABS
1000.0000 mg | ORAL_TABLET | Freq: Once | ORAL | Status: AC
  Administered 2020-04-15: 1000 mg via ORAL
  Filled 2020-04-15: qty 2

## 2020-04-15 MED ORDER — CYCLOBENZAPRINE HCL 5 MG PO TABS
5.0000 mg | ORAL_TABLET | Freq: Once | ORAL | Status: AC
  Administered 2020-04-15: 5 mg via ORAL
  Filled 2020-04-15: qty 1

## 2020-04-15 NOTE — MAU Note (Signed)
Sent from office from elevated b/p report mild headache. Some dizziness reported yesterday. C/o lower abd pressure and pain when she walks. Good fetal movement reported.

## 2020-04-15 NOTE — Discharge Instructions (Signed)
Preeclampsia and Eclampsia Preeclampsia is a serious condition that may develop during pregnancy. This condition involves high blood pressure during pregnancy and causes symptoms such as headaches, vision changes, and increased swelling in the legs, hands, and face. Preeclampsia occurs after 20 weeks of pregnancy. Eclampsia is a seizure that happens from worsening preeclampsia. Diagnosing and managing preeclampsia early is important. If not treated early, it can cause serious problems for mother and baby. There is no cure for this condition. However, during pregnancy, delivering the baby may be the best treatment for preeclampsia or eclampsia. For most women, symptoms of preeclampsia and eclampsia go away after giving birth. In rare cases, a woman may develop preeclampsia or eclampsia after giving birth. This usually occurs within 48 hours after childbirth but may occur up to 6 weeks after giving birth. What are the causes? The cause of this condition is not known. What increases the risk? The following factors make you more likely to develop preeclampsia:  Being pregnant for the first time or being pregnant with multiples.  Having had preeclampsia or a condition called hemolysis, elevated liver enzymes, and low platelet count (HELLP)syndrome during a past pregnancy.  Having a family history of preeclampsia.  Being older than age 35.  Being obese.  Becoming pregnant through fertility treatments. Conditions that reduce blood flow or oxygen to your placenta and baby may also increase your risk. These include:  High blood pressure before, during, or immediately following pregnancy.  Kidney disease.  Diabetes.  Blood clotting disorders.  Autoimmune diseases, such as lupus.  Sleep apnea. What are the signs or symptoms? Common symptoms of this condition include:  A severe, throbbing headache that does not go away.  Vision problems, such as blurred or double vision and light  sensitivity.  Pain in the stomach, especially the right upper region.  Pain in the shoulder. Other symptoms that may develop as the condition gets worse include:  Sudden weight gain because of fluid buildup in the body. This causes swelling of the face, hands, legs, and feet.  Severe nausea and vomiting.  Urinating less than usual.  Shortness of breath.  Seizures. How is this diagnosed? Your health care provider will ask you about symptoms and check for signs of preeclampsia during your prenatal visits. You will also have routine tests, including:  Checking your blood pressure.  Urine tests to check for protein.  Blood tests to assess your organ function.  Monitoring your baby's heart rate.  Ultrasounds to check fetal growth.   How is this treated? You and your health care provider will determine the treatment that is best for you. Treatment may include:  Frequent prenatal visits to check for preeclampsia.  Medicine to lower your blood pressure.  Medicine to prevent seizures.  Low-dose aspirin during your pregnancy.  Staying in the hospital, in severe cases. You will be given medicines to control your blood pressure and the amount of fluids in your body.  Delivering your baby. Work with your health care provider to manage any chronic health conditions, such as diabetes or kidney problems. Also, work with your health care provider to manage weight gain during pregnancy. Follow these instructions at home: Eating and drinking  Drink enough fluid to keep your urine pale yellow.  Avoid caffeine. Caffeine may increase blood pressure and heart rate and lead to dehydration.  Reduce the amount of salt that you eat. Lifestyle  Do not use any products that contain nicotine or tobacco. These products include cigarettes, chewing tobacco, and   vaping devices, such as e-cigarettes. If you need help quitting, ask your health care provider.  Do not use alcohol or drugs.  Avoid  stress as much as possible.  Rest and get plenty of sleep. General instructions  Take over-the-counter and prescription medicines only as told by your health care provider.  When lying down, lie on your left side. This keeps pressure off your major blood vessels.  When sitting or lying down, raise (elevate) your feet. Try putting pillows underneath your lower legs.  Exercise regularly. Ask your health care provider what kinds of exercise are best for you.  Check your blood pressure as often as recommended by your health care provider.  Keep all prenatal and follow-up visits. This is important.   Contact a health care provider if:  You have symptoms that may need treatment or closer monitoring. These include: ? Headaches. ? Stomach pain or nausea and vomiting. ? Shoulder pain. ? Vision problems, such as spots in front of your eyes or blurry vision. ? Sudden weight gain or increased swelling in your face, hands, legs, and feet. ? Increased anxiety or feeling of impending doom. ? Signs or symptoms of labor. Get help right away if:  You have any of the following symptoms: ? A seizure. ? Shortness of breath or trouble breathing. ? Trouble speaking or slurred speech. ? Fainting. ? Chest pain. These symptoms may represent a serious problem that is an emergency. Do not wait to see if the symptoms will go away. Get medical help right away. Call your local emergency services (911 in the [place] Do not drive yourself to the hospital. Summary  Preeclampsia is a serious condition that may develop during pregnancy.  Diagnosing and treating preeclampsia early is very important.  Keep all prenatal and follow-up visits. This is important.  Get help right away if you have a seizure, shortness of breath or trouble breathing, trouble speaking or slurred speech, chest pain, or fainting. This information is not intended to replace advice given to you by your health care provider. Make sure you  discuss any questions you have with your health care provider. Document Revised: 11/13/2019 Document Reviewed: 11/13/2019 Elsevier Patient Education  2021 Elsevier Inc.  

## 2020-04-15 NOTE — MAU Provider Note (Signed)
Chief Complaint  Patient presents with  . Hypertension     Event Date/Time   First Provider Initiated Contact with Patient 04/15/20 1445      S: Sydney Kelley  is a 20 y.o. y.o. year old G1P0 female at [redacted]w[redacted]d weeks gestation who presents to MAU with elevated blood pressures and headache. Patient reports symptoms started occurring past Saturday on 1/5 when she was seen in MAU. Since then reports HA has gotten progressively worse - rates HA 6/10, has not taken any medication for HA prior to coming to MAU. Patient reports taking BP at home and entering them into babyscripts, some blood pressures over the past few days have been 147/92, 142/80. No currently on blood pressure medication or diagnosed with HTN during this pregnancy. Denies vision changes or epigastric pain.   Patient reports that she has also had continued pelvic pressure and hip pain. Denies abdominal pain, cramping or contractions. Patient reports +FM.   O:  Patient Vitals for the past 24 hrs:  BP Temp Pulse Resp Height Weight  04/15/20 1601 131/71 -- 81 -- -- --  04/15/20 1546 127/68 -- 94 -- -- --  04/15/20 1531 (!) 130/58 -- 91 -- -- --  04/15/20 1516 132/61 -- 99 -- -- --  04/15/20 1501 124/76 -- 95 -- -- --  04/15/20 1446 118/66 -- (!) 102 -- -- --  04/15/20 1432 (!) 144/91 -- 98 -- -- --  04/15/20 1406 139/73 98.2 F (36.8 C) 94 18 5\' 4"  (1.626 m) 116.1 kg   General: NAD Heart: Regular rate Lungs: Normal rate and effort Abd: Soft, NT, Gravid, S=D Extremities: +2 Pedal edema Neuro: 2+ deep tendon reflexes, No clonus Dilation: Closed Effacement (%): Thick Cervical Position: Posterior Station: -3 Presentation: Vertex Exam by:: [name] CNM  Fetal monitoring:  135/moderate/+accels/no decel  UI  Results for orders placed or performed during the hospital encounter of 04/15/20 (from the past 24 hour(s))  Protein / creatinine ratio, urine     Status: Abnormal   Collection Time: 04/15/20  2:15 PM  Result Value  Ref Range   Creatinine, Urine 49.58 mg/dL   Total Protein, Urine 8 mg/dL   Protein Creatinine Ratio 0.16 (H) 0.00 - 0.15 mg/mg[Cre]  Urinalysis, Routine w reflex microscopic Urine, Clean Catch     Status: None   Collection Time: 04/15/20  2:15 PM  Result Value Ref Range   Color, Urine YELLOW YELLOW   APPearance CLEAR CLEAR   Specific Gravity, Urine 1.010 1.005 - 1.030   pH 7.0 5.0 - 8.0   Glucose, UA NEGATIVE NEGATIVE mg/dL   Hgb urine dipstick NEGATIVE NEGATIVE   Bilirubin Urine NEGATIVE NEGATIVE   Ketones, ur NEGATIVE NEGATIVE mg/dL   Protein, ur NEGATIVE NEGATIVE mg/dL   Nitrite NEGATIVE NEGATIVE   Leukocytes,Ua NEGATIVE NEGATIVE  Comprehensive metabolic panel     Status: Abnormal   Collection Time: 04/15/20  2:38 PM  Result Value Ref Range   Sodium 138 135 - 145 mmol/L   Potassium 3.9 3.5 - 5.1 mmol/L   Chloride 108 98 - 111 mmol/L   CO2 20 (L) 22 - 32 mmol/L   Glucose, Bld 117 (H) 70 - 99 mg/dL   BUN 5 (L) 6 - 20 mg/dL   Creatinine, Ser 06/13/20 0.44 - 1.00 mg/dL   Calcium 8.6 (L) 8.9 - 10.3 mg/dL   Total Protein 6.2 (L) 6.5 - 8.1 g/dL   Albumin 2.7 (L) 3.5 - 5.0 g/dL   AST 16 15 -  41 U/L   ALT 13 0 - 44 U/L   Alkaline Phosphatase 162 (H) 38 - 126 U/L   Total Bilirubin 0.2 (L) 0.3 - 1.2 mg/dL   GFR, Estimated >46 >50 mL/min   Anion gap 10 5 - 15  CBC     Status: Abnormal   Collection Time: 04/15/20  2:38 PM  Result Value Ref Range   WBC 10.7 (H) 4.0 - 10.5 K/uL   RBC 4.03 3.87 - 5.11 MIL/uL   Hemoglobin 9.8 (L) 12.0 - 15.0 g/dL   HCT 35.4 (L) 65.6 - 81.2 %   MCV 78.4 (L) 80.0 - 100.0 fL   MCH 24.3 (L) 26.0 - 34.0 pg   MCHC 31.0 30.0 - 36.0 g/dL   RDW 75.1 (H) 70.0 - 17.4 %   Platelets 234 150 - 400 K/uL   nRBC 0.0 0.0 - 0.2 %   Treatments in MAU including tylenol and flexeril for HA and PEC workup Reassessment of HA @ 1515, patient reports HA is down to 1-2/10 after medication. Labs pending at this time.   Reassessment @1605  once labs resulted. PEC labs  negative. Patient reports that she was able to sleep and HA is completely resolved.   Urgent message sent to office for BP check tomorrow. Patient had 1 elevated BP in MAU otherwise normal. Discussed reasons to return to MAU. PEC precautions reviewed. Return to MAU as needed. Pt stable at time of discharge.    A:  1. Elevated BP without diagnosis of hypertension   2. NST (non-stress test) reactive   3. Headache in pregnancy, antepartum, third trimester   4. [redacted] weeks gestation of pregnancy     P: Discharge home in stable condition per consult with Dr  Preeclampsia precautions. Follow-up for blood pressure check in 1 day at your doctor's office sooner as needed if symptoms worsen. Return to maternity admissions as needed in emergencies  Crissie Reese, Sharyon Cable 04/15/2020 4:17 PM

## 2020-04-16 ENCOUNTER — Ambulatory Visit: Payer: Medicaid Other | Admitting: *Deleted

## 2020-04-16 DIAGNOSIS — Z3400 Encounter for supervision of normal first pregnancy, unspecified trimester: Secondary | ICD-10-CM

## 2020-04-16 NOTE — Progress Notes (Signed)
Patient was assessed and managed by nursing staff during this encounter. I have reviewed the chart and agree with the documentation and plan. I have also made any necessary editorial changes.  Scheryl Darter, MD 04/16/2020 10:40 AM  Patient ID: Sydney Kelley, female   DOB: [birth year], 19 y.o.   MRN: [id number]

## 2020-04-16 NOTE — Progress Notes (Signed)
Subjective:  Sydney Kelley is a 20 y.o. female here for BP check.   Hypertension ROS: taking medications as instructed, no medication side effects noted, no TIA's, no chest pain on exertion, no dyspnea on exertion and notes mild HA and some swelling in hands/feet..    Objective:  BP 122/78   LMP 08/08/2019 (Exact Date)    Appearance alert, well appearing, and in no distress.  General exam BP noted to be well controlled today in office.    Assessment:   Blood Pressure stable.   Plan:  Current treatment plan is effective, no change in therapy.  Pt has ROB appt on Monday.  Advised to continue to monitor BP at home. Return precautions given and advised she may be seen at hospital if she becomes symptomatic.

## 2020-04-19 ENCOUNTER — Ambulatory Visit (INDEPENDENT_AMBULATORY_CARE_PROVIDER_SITE_OTHER): Payer: Medicaid Other | Admitting: Obstetrics and Gynecology

## 2020-04-19 ENCOUNTER — Other Ambulatory Visit (HOSPITAL_COMMUNITY)
Admission: RE | Admit: 2020-04-19 | Discharge: 2020-04-19 | Disposition: A | Discharge status: Home / Self Care | Payer: Medicaid Other | Source: Ambulatory Visit | Attending: Obstetrics and Gynecology | Admitting: Obstetrics and Gynecology

## 2020-04-19 ENCOUNTER — Other Ambulatory Visit: Payer: Self-pay

## 2020-04-19 ENCOUNTER — Encounter: Payer: Self-pay | Admitting: Obstetrics and Gynecology

## 2020-04-19 VITALS — BP 129/79 | HR 104 | Wt 256.0 lb

## 2020-04-19 DIAGNOSIS — Z3403 Encounter for supervision of normal first pregnancy, third trimester: Secondary | ICD-10-CM | POA: Diagnosis not present

## 2020-04-19 NOTE — Progress Notes (Signed)
   PRENATAL VISIT NOTE  Subjective:  Sydney Kelley is a 20 y.o. G1P0 at [redacted]w[redacted]d being seen today for ongoing prenatal care.  She is currently monitored for the following issues for this low-risk pregnancy and has Encounter for supervision of normal first pregnancy and LGSIL on Pap smear of cervix on their problem list.  Patient reports no complaints.  Contractions: Not present. Vag. Bleeding: None.  Movement: Present. Denies leaking of fluid.   The following portions of the patient's history were reviewed and updated as appropriate: allergies, current medications, past family history, past medical history, past social history, past surgical history and problem list.   Objective:   Vitals:   04/19/20 1008  BP: 129/79  Pulse: (!) 104  Weight: 256 lb (116.1 kg)    Fetal Status: Fetal Heart Rate (bpm): 145 Fundal Height: 36 cm Movement: Present     General:  Alert, oriented and cooperative. Patient is in no acute distress.  Skin: Skin is warm and dry. No rash noted.   Cardiovascular: Normal heart rate noted  Respiratory: Normal respiratory effort, no problems with respiration noted  Abdomen: Soft, gravid, appropriate for gestational age.  Pain/Pressure: Present     Pelvic: Cervical exam performed in the presence of a chaperone Dilation: Closed Effacement (%): Thick Station: Ballotable  Extremities: Normal range of motion.  Edema: Trace  Mental Status: Normal mood and affect. Normal behavior. Normal judgment and thought content.   Assessment and Plan:  Pregnancy: G1P0 at [redacted]w[redacted]d 1. Encounter for supervision of normal first pregnancy in third trimester Patient is doing well without complaints Cultures today Patient undecided on pediatrician Patient plans pills for contraception Follow up ultrasound on Friday  Preterm labor symptoms and general obstetric precautions including but not limited to vaginal bleeding, contractions, leaking of fluid and fetal movement were reviewed in detail  with the patient. Please refer to After Visit Summary for other counseling recommendations.   No follow-ups on file.  Future Appointments  Date Time Provider Department Center  04/23/2020 11:00 AM [facility] NURSE [facility]  04/23/2020 11:15 AM [facility] US2 [facility]    Catalina Antigua, MD

## 2020-04-19 NOTE — Progress Notes (Signed)
Pt states increase in LE swelling and having some HA's.

## 2020-04-20 LAB — CERVICOVAGINAL ANCILLARY ONLY
Chlamydia: NEGATIVE
Comment: NEGATIVE
Comment: NORMAL
Neisseria Gonorrhea: NEGATIVE

## 2020-04-21 LAB — STREP GP B NAA: Strep Gp B NAA: NEGATIVE

## 2020-04-23 ENCOUNTER — Encounter: Payer: Self-pay | Admitting: *Deleted

## 2020-04-23 ENCOUNTER — Ambulatory Visit: Payer: Medicaid Other | Admitting: *Deleted

## 2020-04-23 ENCOUNTER — Other Ambulatory Visit: Payer: Self-pay

## 2020-04-23 ENCOUNTER — Ambulatory Visit: Payer: Medicaid Other | Attending: Maternal & Fetal Medicine

## 2020-04-23 VITALS — BP 130/65 | HR 105

## 2020-04-23 DIAGNOSIS — O358XX0 Maternal care for other (suspected) fetal abnormality and damage, not applicable or unspecified: Secondary | ICD-10-CM | POA: Insufficient documentation

## 2020-04-23 DIAGNOSIS — O3663X0 Maternal care for excessive fetal growth, third trimester, not applicable or unspecified: Secondary | ICD-10-CM | POA: Diagnosis not present

## 2020-04-23 DIAGNOSIS — E669 Obesity, unspecified: Secondary | ICD-10-CM | POA: Diagnosis not present

## 2020-04-23 DIAGNOSIS — O99830 Other infection carrier state complicating pregnancy: Secondary | ICD-10-CM | POA: Diagnosis not present

## 2020-04-23 DIAGNOSIS — O99213 Obesity complicating pregnancy, third trimester: Secondary | ICD-10-CM | POA: Diagnosis not present

## 2020-04-23 DIAGNOSIS — O35EXX0 Maternal care for other (suspected) fetal abnormality and damage, fetal genitourinary anomalies, not applicable or unspecified: Secondary | ICD-10-CM

## 2020-04-23 DIAGNOSIS — Z228 Carrier of other infectious diseases: Secondary | ICD-10-CM | POA: Diagnosis not present

## 2020-04-23 DIAGNOSIS — O283 Abnormal ultrasonic finding on antenatal screening of mother: Secondary | ICD-10-CM

## 2020-04-23 DIAGNOSIS — Z3A37 37 weeks gestation of pregnancy: Secondary | ICD-10-CM

## 2020-04-26 ENCOUNTER — Other Ambulatory Visit: Payer: Self-pay

## 2020-04-26 ENCOUNTER — Ambulatory Visit (INDEPENDENT_AMBULATORY_CARE_PROVIDER_SITE_OTHER): Payer: Medicaid Other | Admitting: Advanced Practice Midwife

## 2020-04-26 VITALS — BP 130/84 | HR 102 | Wt 255.0 lb

## 2020-04-26 DIAGNOSIS — Z3400 Encounter for supervision of normal first pregnancy, unspecified trimester: Secondary | ICD-10-CM

## 2020-04-26 DIAGNOSIS — M549 Dorsalgia, unspecified: Secondary | ICD-10-CM

## 2020-04-26 DIAGNOSIS — Z3A37 37 weeks gestation of pregnancy: Secondary | ICD-10-CM

## 2020-04-26 DIAGNOSIS — O3663X1 Maternal care for excessive fetal growth, third trimester, fetus 1: Secondary | ICD-10-CM

## 2020-04-26 DIAGNOSIS — O99891 Other specified diseases and conditions complicating pregnancy: Secondary | ICD-10-CM

## 2020-04-26 NOTE — Progress Notes (Signed)
   PRENATAL VISIT NOTE  Subjective:  Sydney Kelley is a 20 y.o. G1P0 at [redacted]w[redacted]d being seen today for ongoing prenatal care.  She is currently monitored for the following issues for this low-risk pregnancy and has Encounter for supervision of normal first pregnancy and LGSIL on Pap smear of cervix on their problem list.  Patient reports occasional contractions.  Contractions: Not present. Vag. Bleeding: None.  Movement: Present. Denies leaking of fluid.   The following portions of the patient's history were reviewed and updated as appropriate: allergies, current medications, past family history, past medical history, past social history, past surgical history and problem list.   Objective:   Vitals:   04/26/20 1456  BP: 130/84  Pulse: (!) 102  Weight: 255 lb (115.7 kg)    Fetal Status: Fetal Heart Rate (bpm): 140   Movement: Present     General:  Alert, oriented and cooperative. Patient is in no acute distress.  Skin: Skin is warm and dry. No rash noted.   Cardiovascular: Normal heart rate noted  Respiratory: Normal respiratory effort, no problems with respiration noted  Abdomen: Soft, gravid, appropriate for gestational age.  Pain/Pressure: Present     Pelvic: Cervical exam performed in the presence of a chaperone        Extremities: Normal range of motion.     Mental Status: Normal mood and affect. Normal behavior. Normal judgment and thought content.   Assessment and Plan:  Pregnancy: G1P0 at [redacted]w[redacted]d 1. Supervision of normal first pregnancy, antepartum --Anticipatory guidance about next visits/weeks of pregnancy given. --Discussed labor readiness including evening primrose oil, raspberry leaf tea, and the [employer].  Labor precautions reviewed. --Next visit in 1 week in the office  2. Back pain affecting pregnancy in third trimester --Low back pain, intermittent, associated with pelvic pressure. --may be normal, third trimester pregnancy, but UTI in early pregnancy so will  retest - Culture, OB Urine  3. [redacted] weeks gestation of pregnancy   4. Large for gestational age fetus affecting management of mother, antepartum, third trimester, fetus 1 --[place] on 04/23/20 with EFW >99%tile, 9lb 5 oz. --Pt with normal glucose testing --EFW by [place] closer to 8 lbs, but difficult due to body habitus --Pt asked about IOL. Discussed that IOL is not recommended for LGA as only risk factor but will consider IOL after 39 weeks for favorable cervix.   --F/U next week   Term labor symptoms and general obstetric precautions including but not limited to vaginal bleeding, contractions, leaking of fluid and fetal movement were reviewed in detail with the patient. Please refer to After Visit Summary for other counseling recommendations.   No follow-ups on file.  Future Appointments  Date Time Provider Department Center  05/03/2020  2:00 PM Sharyon Cable, CNM [facility] None  05/10/2020  2:20 PM Leftwich-Kirby, Wilmer Floor, CNM [facility] None    Sharen Counter, CNM

## 2020-04-26 NOTE — Progress Notes (Signed)
Pt is having some low back pain and pelvic pressure.

## 2020-04-26 NOTE — Patient Instructions (Signed)
Things to Try After 37 weeks to Encourage Labor/Get Ready for Labor:   1.  Try the Miles Circuit at www.milescircuit.com daily to improve baby's position and encourage the onset of labor.  2. Walk a little and rest a little every day.  Change positions often.  3. Cervical Ripening: May try one or both a. Red Raspberry Leaf capsules or tea:  two 300mg or 400mg tablets with each meal, 2-3 times a day, or 1-3 cups of tea daily  Potential Side Effects Of Raspberry Leaf:  Most women do not experience any side effects from drinking raspberry leaf tea. However, nausea and loose stools are possible   b. Evening Primrose Oil capsules: take 1 capsule by mouth and place one capsule in the vagina every night.    Some of the potential side effects:  Upset stomach  Loose stools or diarrhea  Headaches  Nausea  4. Sex can also help the cervix ripen and encourage labor onset.    Labor Precautions Reasons to come to MAU at [facility]:  1.  Contractions are  5 minutes apart or less, each last 1 minute, these have been going on for 1-2 hours, and you cannot walk or talk during them 2.  You have a large gush of fluid, or a trickle of fluid that will not stop and you have to wear a pad 3.  You have bleeding that is bright red, heavier than spotting--like menstrual bleeding (spotting can be normal in early labor or after a check of your cervix) 4.  You do not feel the baby moving like he/she normally does 

## 2020-04-28 LAB — CULTURE, OB URINE

## 2020-04-28 LAB — URINE CULTURE, OB REFLEX

## 2020-05-03 ENCOUNTER — Other Ambulatory Visit: Payer: Self-pay

## 2020-05-03 ENCOUNTER — Encounter: Payer: Self-pay | Admitting: Certified Nurse Midwife

## 2020-05-03 ENCOUNTER — Encounter (HOSPITAL_COMMUNITY): Payer: Self-pay | Admitting: Family Medicine

## 2020-05-03 ENCOUNTER — Inpatient Hospital Stay (HOSPITAL_COMMUNITY)
Admission: AD | Admit: 2020-05-03 | Discharge: 2020-05-06 | DRG: 805 | Disposition: A | Discharge status: Home / Self Care | Payer: Medicaid Other | Attending: Obstetrics and Gynecology | Admitting: Obstetrics and Gynecology

## 2020-05-03 ENCOUNTER — Encounter: Payer: Self-pay | Admitting: Family Medicine

## 2020-05-03 ENCOUNTER — Ambulatory Visit (INDEPENDENT_AMBULATORY_CARE_PROVIDER_SITE_OTHER): Payer: Medicaid Other | Admitting: Certified Nurse Midwife

## 2020-05-03 VITALS — BP 129/83 | HR 101 | Wt 259.0 lb

## 2020-05-03 DIAGNOSIS — O358XX0 Maternal care for other (suspected) fetal abnormality and damage, not applicable or unspecified: Secondary | ICD-10-CM | POA: Diagnosis present

## 2020-05-03 DIAGNOSIS — O99891 Other specified diseases and conditions complicating pregnancy: Secondary | ICD-10-CM

## 2020-05-03 DIAGNOSIS — O9852 Other viral diseases complicating childbirth: Secondary | ICD-10-CM | POA: Diagnosis present

## 2020-05-03 DIAGNOSIS — Z2839 Other underimmunization status: Secondary | ICD-10-CM | POA: Insufficient documentation

## 2020-05-03 DIAGNOSIS — Z283 Underimmunization status: Secondary | ICD-10-CM | POA: Insufficient documentation

## 2020-05-03 DIAGNOSIS — D509 Iron deficiency anemia, unspecified: Secondary | ICD-10-CM | POA: Diagnosis present

## 2020-05-03 DIAGNOSIS — O99214 Obesity complicating childbirth: Secondary | ICD-10-CM | POA: Diagnosis present

## 2020-05-03 DIAGNOSIS — Z3400 Encounter for supervision of normal first pregnancy, unspecified trimester: Secondary | ICD-10-CM

## 2020-05-03 DIAGNOSIS — O3663X1 Maternal care for excessive fetal growth, third trimester, fetus 1: Secondary | ICD-10-CM

## 2020-05-03 DIAGNOSIS — O133 Gestational [pregnancy-induced] hypertension without significant proteinuria, third trimester: Secondary | ICD-10-CM | POA: Diagnosis present

## 2020-05-03 DIAGNOSIS — Z3A38 38 weeks gestation of pregnancy: Secondary | ICD-10-CM | POA: Diagnosis not present

## 2020-05-03 DIAGNOSIS — Z23 Encounter for immunization: Secondary | ICD-10-CM | POA: Diagnosis not present

## 2020-05-03 DIAGNOSIS — U071 COVID-19: Secondary | ICD-10-CM | POA: Diagnosis present

## 2020-05-03 DIAGNOSIS — O9902 Anemia complicating childbirth: Secondary | ICD-10-CM | POA: Diagnosis present

## 2020-05-03 DIAGNOSIS — O134 Gestational [pregnancy-induced] hypertension without significant proteinuria, complicating childbirth: Secondary | ICD-10-CM | POA: Diagnosis present

## 2020-05-03 DIAGNOSIS — O3663X0 Maternal care for excessive fetal growth, third trimester, not applicable or unspecified: Secondary | ICD-10-CM | POA: Diagnosis present

## 2020-05-03 DIAGNOSIS — O139 Gestational [pregnancy-induced] hypertension without significant proteinuria, unspecified trimester: Secondary | ICD-10-CM | POA: Insufficient documentation

## 2020-05-03 DIAGNOSIS — Z8759 Personal history of other complications of pregnancy, childbirth and the puerperium: Secondary | ICD-10-CM | POA: Diagnosis present

## 2020-05-03 DIAGNOSIS — O99019 Anemia complicating pregnancy, unspecified trimester: Secondary | ICD-10-CM | POA: Diagnosis present

## 2020-05-03 DIAGNOSIS — O09899 Supervision of other high risk pregnancies, unspecified trimester: Secondary | ICD-10-CM

## 2020-05-03 DIAGNOSIS — R87612 Low grade squamous intraepithelial lesion on cytologic smear of cervix (LGSIL): Secondary | ICD-10-CM | POA: Diagnosis present

## 2020-05-03 LAB — RESP PANEL BY RT-PCR (FLU A&B, COVID) ARPGX2
Influenza A by PCR: NEGATIVE
Influenza B by PCR: NEGATIVE
SARS Coronavirus 2 by RT PCR: POSITIVE — AB

## 2020-05-03 LAB — COMPREHENSIVE METABOLIC PANEL
ALT: 13 U/L (ref 0–44)
AST: 23 U/L (ref 15–41)
Albumin: 2.8 g/dL — ABNORMAL LOW (ref 3.5–5.0)
Alkaline Phosphatase: 168 U/L — ABNORMAL HIGH (ref 38–126)
Anion gap: 10 (ref 5–15)
BUN: 10 mg/dL (ref 6–20)
CO2: 19 mmol/L — ABNORMAL LOW (ref 22–32)
Calcium: 9.1 mg/dL (ref 8.9–10.3)
Chloride: 106 mmol/L (ref 98–111)
Creatinine, Ser: 0.55 mg/dL (ref 0.44–1.00)
GFR, Estimated: >60 mL/min (ref >60)
Glucose, Bld: 89 mg/dL (ref 70–99)
Potassium: 4.3 mmol/L (ref 3.5–5.1)
Sodium: 135 mmol/L (ref 135–145)
Total Bilirubin: 0.4 mg/dL (ref 0.3–1.2)
Total Protein: 6 g/dL — ABNORMAL LOW (ref 6.5–8.1)

## 2020-05-03 LAB — PROTEIN / CREATININE RATIO, URINE
Creatinine, Urine: 111.66 mg/dL
Protein Creatinine Ratio: 0.26 mg/mg{Cre} — ABNORMAL HIGH (ref 0.00–0.15)
Total Protein, Urine: 29 mg/dL

## 2020-05-03 LAB — CBC
HCT: 31.5 % — ABNORMAL LOW (ref 36.0–46.0)
Hemoglobin: 9.7 g/dL — ABNORMAL LOW (ref 12.0–15.0)
MCH: 23.7 pg — ABNORMAL LOW (ref 26.0–34.0)
MCHC: 30.8 g/dL (ref 30.0–36.0)
MCV: 76.8 fL — ABNORMAL LOW (ref 80.0–100.0)
Platelets: 218 10*3/uL (ref 150–400)
RBC: 4.1 MIL/uL (ref 3.87–5.11)
RDW: 16.5 % — ABNORMAL HIGH (ref 11.5–15.5)
WBC: 10.8 10*3/uL — ABNORMAL HIGH (ref 4.0–10.5)
nRBC: 0 % (ref 0.0–0.2)

## 2020-05-03 LAB — TYPE AND SCREEN
ABO/RH(D): O POS
Antibody Screen: NEGATIVE

## 2020-05-03 MED ORDER — OXYTOCIN BOLUS FROM INFUSION
333.0000 mL | Freq: Once | INTRAVENOUS | Status: AC
  Administered 2020-05-04: 333 mL via INTRAVENOUS

## 2020-05-03 MED ORDER — SOD CITRATE-CITRIC ACID 500-334 MG/5ML PO SOLN: 30.0000 mL | ORAL | Status: DC | PRN

## 2020-05-03 MED ORDER — LACTATED RINGERS IV SOLN
500.0000 mL | INTRAVENOUS | Status: DC | PRN
  Administered 2020-05-04 (×2): 500 mL via INTRAVENOUS

## 2020-05-03 MED ORDER — OXYTOCIN-SODIUM CHLORIDE 30-0.9 UT/500ML-% IV SOLN: 2.5000 [IU]/h | INTRAVENOUS | Status: DC

## 2020-05-03 MED ORDER — TERBUTALINE SULFATE 1 MG/ML IJ SOLN: 0.2500 mg | Freq: Once | INTRAMUSCULAR | Status: DC | PRN

## 2020-05-03 MED ORDER — MISOPROSTOL 25 MCG QUARTER TABLET: 25.0000 ug | ORAL_TABLET | ORAL | Status: DC | PRN

## 2020-05-03 MED ORDER — ONDANSETRON HCL 4 MG/2ML IJ SOLN
4.0000 mg | Freq: Four times a day (QID) | INTRAMUSCULAR | Status: DC | PRN
Start: 2020-05-03 — End: 2020-05-05

## 2020-05-03 MED ORDER — MISOPROSTOL 50MCG HALF TABLET
50.0000 ug | ORAL_TABLET | ORAL | Status: DC | PRN
  Administered 2020-05-03 (×2): 50 ug via BUCCAL
  Filled 2020-05-03 (×2): qty 1

## 2020-05-03 MED ORDER — ACETAMINOPHEN 325 MG PO TABS
650.0000 mg | ORAL_TABLET | ORAL | Status: DC | PRN
Start: 2020-05-03 — End: 2020-05-05
  Administered 2020-05-03 – 2020-05-04 (×2): 650 mg via ORAL
  Filled 2020-05-03 (×2): qty 2

## 2020-05-03 MED ORDER — LIDOCAINE HCL (PF) 1 % IJ SOLN: 30.0000 mL | INTRAMUSCULAR | Status: DC | PRN

## 2020-05-03 MED ORDER — LACTATED RINGERS IV SOLN: INTRAVENOUS | Status: DC

## 2020-05-03 NOTE — Progress Notes (Signed)
   PRENATAL VISIT NOTE  Subjective:  Sydney Kelley is a 20 y.o. G1P0 at [redacted]w[redacted]d being seen today for ongoing prenatal care.  She is currently monitored for the following issues for this low-risk pregnancy and has Encounter for supervision of normal first pregnancy; LGSIL on Pap smear of cervix; Large for gestational age fetus affecting management of mother, antepartum, third trimester, fetus 1; Gestational hypertension; and Rubella non-immune status, antepartum on their problem list.  Patient reports headache.  Contractions: Not present. Vag. Bleeding: None.  Movement: Present. Denies leaking of fluid.   The following portions of the patient's history were reviewed and updated as appropriate: allergies, current medications, past family history, past medical history, past social history, past surgical history and problem list.   Objective:   Vitals:   05/03/20 1349 05/03/20 1409  BP: (!) 144/85 129/83  Pulse: (!) 101   Weight: 259 lb (117.5 kg)     Fetal Status: Fetal Heart Rate (bpm): 143 Fundal Height: 39 cm Movement: Present     General:  Alert, oriented and cooperative. Patient is in no acute distress.  Skin: Skin is warm and dry. No rash noted.   Cardiovascular: Normal heart rate noted  Respiratory: Normal respiratory effort, no problems with respiration noted  Abdomen: Soft, gravid, appropriate for gestational age.  Pain/Pressure: Present     Pelvic: Cervical exam performed in the presence of a chaperone Dilation: Closed Effacement (%): 50 Station: -3  Extremities: Normal range of motion.  Edema: Moderate pitting, indentation subsides rapidly  Mental Status: Normal mood and affect. Normal behavior. Normal judgment and thought content.   Assessment and Plan:  Pregnancy: G1P0 at [redacted]w[redacted]d 1. Supervision of normal first pregnancy, antepartum - Patient doing well, reports HA on/off for the past couple of days. Patient reports she currently has a HA that has not went away but denies taking  any medication for HA either  - Patient elevated BP in MAU on 2/10, in the office on 2/18 and today - patient meet criteria for diagnoses of GHTN  - Patient sent to hospital for direct admission and delivery, patient has to go home and grab bags plans to be at hospital by 5pm.  - Consult with Dr Adrian Blackwater who agrees with plan of care  - Orders for admission placed - Labor team notified of direct admission   2. Large for gestational age fetus affecting management of mother, antepartum, third trimester, fetus 1  3. [redacted] weeks gestation of pregnancy  Term labor symptoms and general obstetric precautions including but not limited to vaginal bleeding, contractions, leaking of fluid and fetal movement were reviewed in detail with the patient. Please refer to After Visit Summary for other counseling recommendations.   Return in about 4 weeks (around 05/31/2020) for POSTPARTUM.  Sharyon Cable, CNM

## 2020-05-03 NOTE — Progress Notes (Signed)
+   Fetal movement. Pt states she is having increased pressure in her groin hips. Denies any issues with urination.

## 2020-05-03 NOTE — H&P (Addendum)
OBSTETRIC ADMISSION HISTORY AND PHYSICAL  Sydney Kelley is a 20 y.o. female G1P0 with IUP at 15w3dby LMP presenting for IOL for gHTN. She reports +FMs, No LOF, no VB.  Reports headache today, but no blurry vision, peripheral edema, or RUQ pain.  She did not take any medication for this headache. She plans on breast feeding. She is undecided about contraception. [place] [facility] --->  Estimated Date of Delivery: 05/14/20  Sono:    _0 , CWD, normal anatomy, cephalic presentation, anterior placenta, 4211g, >99% EFW   Prenatal History/Complications:  -gHTN: Diagnosed at prenatal visit on 2/28, sent here for induction -Obesity: Current BMI 44.73 -LGA fetus -Rubella NI -Anemia of pregnancy (last hgb 9.8) -LGSIL pap  Past Medical History: Past Medical History:  Diagnosis Date  . Medical history non-contributory     Past Surgical History: Past Surgical History:  Procedure Laterality Date  . NO PAST SURGERIES      Obstetrical History: OB History    Gravida  1   Para      Term      Preterm      AB      Living        SAB      IAB      Ectopic      Multiple      Live Births              Social History Social History   Socioeconomic History  . Marital status: Single    Spouse name: Not on file  . Number of children: Not on file  . Years of education: Not on file  . Highest education level: Not on file  Occupational History  . Not on file  Tobacco Use  . Smoking status: Never Smoker  . Smokeless tobacco: Never Used  Vaping Use  . Vaping Use: Never used  Substance and Sexual Activity  . Alcohol use: Never  . Drug use: Never  . Sexual activity: Yes    Partners: Male    Birth control/protection: None  Other Topics Concern  . Not on file  Social History Narrative  . Not on file   Social Determinants of Health   Financial Resource Strain: Not on file  Food Insecurity: Not on file  Transportation  Needs: Not on file  Physical Activity: Not on file  Stress: Not on file  Social Connections: Not on file    Family History: Family History  Problem Relation Age of Onset  . Diabetes Father   . Diabetes Paternal Uncle     Allergies: No Known Allergies  Medications Prior to Admission  Medication Sig Dispense Refill Last Dose  . acetaminophen (TYLENOL) 500 MG tablet Take 1,000 mg by mouth every 6 (six) hours as needed.     .Marland Kitchenaspirin EC 81 MG tablet Take 1 tablet (81 mg total) by mouth daily. Swallow whole. 30 tablet 11   . cetirizine (ZYRTEC ALLERGY) 10 MG tablet Take 1 tablet (10 mg total) by mouth daily. (Patient not taking: No sig reported) 30 tablet 0   . Elastic Bandages & Supports (COMFORT FIT MATERNITY SUPP MED) MISC 1 Device by Does not apply route daily. 1 each 0   . ferrous sulfate (FERROUSUL) 325 (65 FE) MG tablet Take 1 tablet (325 mg total) by mouth every other day. (Patient not taking: Reported on 05/03/2020) 30 tablet 3   . Prenatal Vit-Fe Fumarate-FA (PRENATAL COMPLETE) 14-0.4  MG TABS Take 1 tablet by mouth daily. 60 tablet 0      Review of Systems   All systems reviewed and negative except as stated in HPI  Blood pressure 135/72, pulse 83, temperature 98.4 F (36.9 C), temperature source Oral, resp. rate 18, height _0  (1.626 m), weight 118.2 kg, last menstrual period 08/08/2019. General appearance: alert, cooperative and no distress Lungs: normal work of breathing Heart: regular rate Abdomen: gravid Pelvic: Closed/thick/high Extremities: no sign of DVT Presentation: cephalic Fetal [id number]/QMGNOIBB/+CWUGQB/VQ decels Uterine activity irregular, q3-6 min Dilation: Closed Effacement (%): Thick Station:  (high) Exam by:: Dr Nyoka Cowden   Prenatal labs: ABO, Rh: --/--/O POS (02/28 1734) Antibody: NEG (02/28 1734) Rubella: <0.90 (08/24 1053) RPR: Non Reactive (12/13 1028)  HBsAg: Negative (08/24 1053)  HIV: Non Reactive (12/13 1028)  GBS: Negative/--  (02/14 1030)  2 hr Glucola 90/160/106, passed Genetic screening NIPS low risk female, AFP negative, Hgb electro neg, CF neg, SMA neg Anatomy US mild left pyelectasis, otherwise normal female.  Bilateral renal pyelectasis on repeat US. LGA on subsequent [place].  Prenatal Transfer Tool  Maternal Diabetes: No Genetic Screening: Normal Maternal Ultrasounds/Referrals: Fetal Kidney Anomalies and Other: LGA Fetal Ultrasounds or other Referrals:  Referred to Materal Fetal Medicine  Maternal Substance Abuse:  No Significant Maternal Medications:  None Significant Maternal Lab Results: Group B Strep negative  Results for orders placed or performed during the hospital encounter of 05/03/20 (from the past 24 hour(s))  CBC   Collection Time: 05/03/20  5:34 PM  Result Value Ref Range   WBC 10.8 (H) 4.0 - 10.5 K/uL   RBC 4.10 3.87 - 5.11 MIL/uL   Hemoglobin 9.7 (L) 12.0 - 15.0 g/dL   HCT 31.5 (L) 36.0 - 46.0 %   MCV 76.8 (L) 80.0 - 100.0 fL   MCH 23.7 (L) 26.0 - 34.0 pg   MCHC 30.8 30.0 - 36.0 g/dL   RDW 16.5 (H) 11.5 - 15.5 %   Platelets 218 150 - 400 K/uL   nRBC 0.0 0.0 - 0.2 %  Comprehensive metabolic panel   Collection Time: 05/03/20  5:34 PM  Result Value Ref Range   Sodium 135 135 - 145 mmol/L   Potassium 4.3 3.5 - 5.1 mmol/L   Chloride 106 98 - 111 mmol/L   CO2 19 (L) 22 - 32 mmol/L   Glucose, Bld 89 70 - 99 mg/dL   BUN 10 6 - 20 mg/dL   Creatinine, Ser 0.55 0.44 - 1.00 mg/dL   Calcium 9.1 8.9 - 10.3 mg/dL   Total Protein 6.0 (L) 6.5 - 8.1 g/dL   Albumin 2.8 (L) 3.5 - 5.0 g/dL   AST 23 15 - 41 U/L   ALT 13 0 - 44 U/L   Alkaline Phosphatase 168 (H) 38 - 126 U/L   Total Bilirubin 0.4 0.3 - 1.2 mg/dL   GFR, Estimated >60 >60 mL/min   Anion gap 10 5 - 15  Protein / creatinine ratio, urine   Collection Time: 05/03/20  5:34 PM  Result Value Ref Range   Creatinine, Urine 111.66 mg/dL   Total Protein, Urine 29 mg/dL   Protein Creatinine Ratio 0.26 (H) 0.00 - 0.15 mg/mg[Cre]  Type and  screen   Collection Time: 05/03/20  5:34 PM  Result Value Ref Range   ABO/RH(D) O POS    Antibody Screen NEG    Sample Expiration      05/06/2020,2359 Performed at [facility], [address]  [place], [place] [address]     Patient Active Problem List   Diagnosis Date Noted  . Gestational hypertension 05/03/2020  . Rubella non-immune status, antepartum 05/03/2020  . Anemia of pregnancy 05/03/2020  . Gestational hypertension, third trimester 05/03/2020  . Large for gestational age fetus affecting management of mother, antepartum, third trimester, fetus 1 04/26/2020  . LGSIL on Pap smear of cervix 11/01/2019  . Encounter for supervision of normal first pregnancy 10/21/2019    Assessment/Plan:  Sydney Kelley is a 20 y.o. G1P0 at 92w3dhere for IOL for gHTN.  #IOL: Closed/thick/high.  Discussed IOL process with patient. Start cervical ripening with Cytotec 50 mcg buccally. #gHTN: Diagnosed at prenatal visit on 2/28, sent here for induction.  Has a headache, no visual disturbances.  Has not had meds yet for this issue. Monitor BP.  No severe range thus far.  Check CBC, CMP, urine P:C to r/o pre-E. Treat headache with tylenol, consider starting mg if not resolved. #Obesity: Current BMI 44.73 #LGA fetus #Rubella NI: MMR pp #Anemia of pregnancy: last hgb 9.8, IV vs. PO iron postpartum #LGSIL pap: Due for repeat pap in August 2022 #Pain: Epidural upon patient request #FWB: Cat I #ID: GBS negative #MOF: Breast #MOC: Undecided.  Counseled on admission. #Circ: Yes  AArrie Senate MD /Ian Bushman MD 05/03/2020, 7:21 PM  GME ATTESTATION:  I saw and evaluated the patient. I agree with the findings and the plan of care as documented in the resident's note.  AArrie Senate MD OB Fellow, [place]2/28/2022 7:22 PM

## 2020-05-03 NOTE — Progress Notes (Signed)
Pt tested positive for COVID on today's PCR. After talking with pt, pt is asymptomatic and tested + for COVID19 03/08/20 and has copy of positive test result [facility]. Test result forwarded to [facility] Infection Prevention Specialist, EC Mullen. Also notified attending and Dr. Barb Merino of this information. Patient and significant other updated that per [facility] COVID19 policy, since within 90 days of + test at [facility], he was able to leave the room and she could have a second visitor. Nursing will continue to assess situation and update.

## 2020-05-04 ENCOUNTER — Inpatient Hospital Stay (HOSPITAL_COMMUNITY): Payer: Medicaid Other | Admitting: Anesthesiology

## 2020-05-04 DIAGNOSIS — O134 Gestational [pregnancy-induced] hypertension without significant proteinuria, complicating childbirth: Secondary | ICD-10-CM

## 2020-05-04 DIAGNOSIS — Z3A38 38 weeks gestation of pregnancy: Secondary | ICD-10-CM

## 2020-05-04 LAB — CBC WITH DIFFERENTIAL/PLATELET
Abs Immature Granulocytes: 0.06 10*3/uL (ref 0.00–0.07)
Basophils Absolute: 0 10*3/uL (ref 0.0–0.1)
Basophils Relative: 0 %
Eosinophils Absolute: 0 10*3/uL (ref 0.0–0.5)
Eosinophils Relative: 0 %
HCT: 30.9 % — ABNORMAL LOW (ref 36.0–46.0)
Hemoglobin: 9.7 g/dL — ABNORMAL LOW (ref 12.0–15.0)
Immature Granulocytes: 1 %
Lymphocytes Relative: 13 %
Lymphs Abs: 1.6 10*3/uL (ref 0.7–4.0)
MCH: 24 pg — ABNORMAL LOW (ref 26.0–34.0)
MCHC: 31.4 g/dL (ref 30.0–36.0)
MCV: 76.3 fL — ABNORMAL LOW (ref 80.0–100.0)
Monocytes Absolute: 0.5 10*3/uL (ref 0.1–1.0)
Monocytes Relative: 4 %
Neutro Abs: 10.8 10*3/uL — ABNORMAL HIGH (ref 1.7–7.7)
Neutrophils Relative %: 82 %
Platelets: 219 10*3/uL (ref 150–400)
RBC: 4.05 MIL/uL (ref 3.87–5.11)
RDW: 16.7 % — ABNORMAL HIGH (ref 11.5–15.5)
WBC: 13 10*3/uL — ABNORMAL HIGH (ref 4.0–10.5)
nRBC: 0 % (ref 0.0–0.2)

## 2020-05-04 LAB — RPR: RPR Ser Ql: NONREACTIVE

## 2020-05-04 MED ORDER — LIDOCAINE HCL (PF) 1 % IJ SOLN
INTRAMUSCULAR | Status: DC | PRN
  Administered 2020-05-04: 3 mL via EPIDURAL
  Administered 2020-05-04: 7 mL via EPIDURAL

## 2020-05-04 MED ORDER — OXYTOCIN-SODIUM CHLORIDE 30-0.9 UT/500ML-% IV SOLN
1.0000 m[IU]/min | INTRAVENOUS | Status: DC
  Administered 2020-05-04 (×2): 2 m[IU]/min via INTRAVENOUS
  Filled 2020-05-04: qty 500

## 2020-05-04 MED ORDER — PHENYLEPHRINE 40 MCG/ML (10ML) SYRINGE FOR IV PUSH (FOR BLOOD PRESSURE SUPPORT)
80.0000 ug | PREFILLED_SYRINGE | INTRAVENOUS | Status: DC | PRN
  Filled 2020-05-04: qty 10

## 2020-05-04 MED ORDER — FENTANYL CITRATE (PF) 100 MCG/2ML IJ SOLN
100.0000 ug | INTRAMUSCULAR | Status: DC | PRN
  Administered 2020-05-04 (×2): 100 ug via INTRAVENOUS
  Filled 2020-05-04 (×3): qty 2

## 2020-05-04 MED ORDER — FENTANYL-BUPIVACAINE-NACL 0.5-0.125-0.9 MG/250ML-% EP SOLN
12.0000 mL/h | EPIDURAL | Status: DC | PRN
  Administered 2020-05-04: 12 mL/h via EPIDURAL
  Filled 2020-05-04 (×2): qty 250

## 2020-05-04 MED ORDER — EPHEDRINE 5 MG/ML INJ: 10.0000 mg | INTRAVENOUS | Status: DC | PRN

## 2020-05-04 MED ORDER — FENTANYL CITRATE (PF) 100 MCG/2ML IJ SOLN
INTRAMUSCULAR | Status: DC | PRN
  Administered 2020-05-04: 100 ug via EPIDURAL

## 2020-05-04 MED ORDER — PHENYLEPHRINE 40 MCG/ML (10ML) SYRINGE FOR IV PUSH (FOR BLOOD PRESSURE SUPPORT): 80.0000 ug | PREFILLED_SYRINGE | INTRAVENOUS | Status: DC | PRN

## 2020-05-04 MED ORDER — TERBUTALINE SULFATE 1 MG/ML IJ SOLN
0.2500 mg | Freq: Once | INTRAMUSCULAR | Status: AC | PRN
  Administered 2020-05-04: 0.25 mg via SUBCUTANEOUS
  Filled 2020-05-04: qty 1

## 2020-05-04 MED ORDER — BUPIVACAINE HCL (PF) 0.25 % IJ SOLN
INTRAMUSCULAR | Status: DC | PRN
  Administered 2020-05-04: 7 mL via EPIDURAL

## 2020-05-04 MED ORDER — DIPHENHYDRAMINE HCL 50 MG/ML IJ SOLN: 12.5000 mg | INTRAMUSCULAR | Status: DC | PRN

## 2020-05-04 MED ORDER — LACTATED RINGERS IV SOLN
500.0000 mL | Freq: Once | INTRAVENOUS | Status: AC
  Administered 2020-05-04: 500 mL via INTRAVENOUS

## 2020-05-04 NOTE — Progress Notes (Signed)
Sydney Kelley is a 20 y.o. G1P0 with IUP at 34w4dby  [facility]  Subjective: Patient reports doing well. She denies pain, headaches or blurry vision.    Objective: BP (!) 106/47   Pulse 83   Temp 98 F (36.7 C) (Oral)   Resp 18   Ht _0  (1.626 m)   Wt 118.2 kg   LMP 08/08/2019 (Exact Date)   SpO2 96%   BMI 44.73 kg/m  No intake/output data recorded. Total I/O In: 2100.3 [P.O.:60; I.V.:2040.3] Out: 150 [Urine:150]   FHT:  FHR: 130 bpm, variability: moderate,  accelerations:  Present,  decelerations:  Absent UC:   regular, every 3 minutes on 8 mU/L of Pitocin  SVE:   Dilation: 3.5 Effacement (%): 60 Station: -3 Exam by:: LArby Barrette RN  Labs: Lab Results  Component Value Date   WBC 13.0 (H) 05/04/2020   HGB 9.7 (L) 05/04/2020   HCT 30.9 (L) 05/04/2020   MCV 76.3 (L) 05/04/2020   PLT 219 05/04/2020    Assessment / Plan: Induction of labor due to gestational hypertension,  progressing well on pitocin  Labor: Progressing on Pitocin, will continue to increase then AROM Fetal Wellbeing:  Category I Pain Control:  Received Epidural  I/D:  n/a  Anticipated MOD:  NSVD   gHTN: Diagnosed at prenatal visit on 05/03/20, sent here for induction. Denies headache, visual disturbances. No severe range pressures noted. Pre-E labs wnl. Will continue to monitor BP.  LGA fetus Rubella NI: MMR post-partum Microcytic anemia: Hgb stable at 9.7. Continue to monitor. Consider IV vs PO iron post-partum. LGSIL pap: due for repeat pap in August 2022   [place] PA-S 05/04/2020, 10:30 AM

## 2020-05-04 NOTE — Progress Notes (Signed)
Cherylann Hobday is a 20 y.o. G1P0 at 77w4dadmitted for IOL for gHTN.  Subjective: Was very uncomfortable previously, but epidural re-dosed and now much more comfortable.  No concerns.   Objective: BP 112/68   Pulse 90   Temp 99.5 F (37.5 C) (Axillary)   Resp 18   Ht _0  (1.626 m)   Wt 118.2 kg   LMP 08/08/2019 (Exact Date)   SpO2 98%   BMI 44.73 kg/m  No intake/output data recorded.  FHT: 145/moderate/+accels/no decels UC: q2-3 min  SVE:   Dilation: Lip/rim Effacement (%): 80,90 Station: 0 Exam by:: Dr. GAstrid Drafts Pitocin @ 4 mu/min  Labs: Lab Results  Component Value Date   WBC 13.0 (H) 05/04/2020   HGB 9.7 (L) 05/04/2020   HCT 30.9 (L) 05/04/2020   MCV 76.3 (L) 05/04/2020   PLT 219 05/04/2020    Assessment / Plan: JLabella Zahradnikis a 20y.o. G1P0 at 358w4ddmitted for IOL for gHTN.  #Labor: S/p cervical ripening with FB, cytotec x2.  Started on pitocin.  Had SROM with check at 1122 on 3/1.  IUPC placed.  Was called complete at 1820 and pushed for about 20 minutes.  On repeat check, called 9.5 and -2 station, so stopped pushing.  Now descended to 0 station, but still 9.5 cm and with caput.  Bedside US[place]erformed this check to better assess fetal position.  Will attempt to reposition patient to facilitate further progression of labor and fetal descent.   #Pain: Epidural. #FWB: Cat I #ID: GBS negative #MOF: Breast #MOC: Undecided, counseled on admission #Circ: Yes #gHTN: Diagnosed at prenatal visit on 2/28, sent here for induction.  Headache on admission, but resolved with Tylenol.  Pre-E labs normal.  No severe range thus far.   #Obesity: Current BMI 44.73 #LGA fetus #Rubella NI: MMR pp #Anemia of pregnancy: last hgb 9.8, IV vs. PO iron postpartum #LGSIL pap: Due for repeat pap in August 2022  [facility] Madelin HeadingsMD 05/04/2020, 9:16 PM

## 2020-05-04 NOTE — Progress Notes (Signed)
Patient ID: Sydney Kelley, female   DOB: 12/14/2000, 20 y.o.   MRN: [id number]  Mostly comfortable w epidural; no s/s pre-e  BPs 144/79, 137/81, P 86 FHR 125-130, +accels, occ mi variables Ctx q 2-3 mins with Pit at 12mu/min Cx 4/70/vtx -2; ROM during exam for clear fluid  IUP@38 .4wks gHTN LGA IOL process  Keep ctx reg with Pitocin titration prn Plan to examine cx in 2-3hrs for progress, or sooner prn Anticipate vag del  [name] [facility] 05/04/2020 11:33 AM

## 2020-05-04 NOTE — Anesthesia Procedure Notes (Signed)
Epidural Patient location during procedure: OB Start time: 05/04/2020 4:25 AM End time: 05/04/2020 4:37 AM  Staffing Anesthesiologist: Lucretia Kern, MD Performed: anesthesiologist   Preanesthetic Checklist Completed: patient identified, IV checked, risks and benefits discussed, monitors and equipment checked, pre-op evaluation and timeout performed  Epidural Patient position: sitting Prep: DuraPrep Patient monitoring: heart rate, continuous pulse ox and blood pressure Approach: midline Location: L3-L4 Injection technique: LOR air  Needle:  Needle type: Tuohy  Needle gauge: 17 G Needle length: 9 cm Needle insertion depth: 7 cm Catheter type: closed end flexible Catheter size: 19 Gauge Catheter at skin depth: 12 cm Test dose: negative  Assessment Events: blood not aspirated, injection not painful, no injection resistance, no paresthesia and negative IV test  Additional Notes Reason for block:procedure for pain

## 2020-05-04 NOTE — Progress Notes (Signed)
Patient ID: Sydney Kelley, female   DOB: 2001-02-01, 20 y.o.   MRN: [id number]  Called complete at 1820 and pushing ~11mins now  BP 141/81, P 98 FHR 140s, +accels, occ variables Ctx q 2-3 mins with Pit at 13mu/min Cx: sm ant floppy lip with a thinner lip from 9-3; vtx at -2 station  IUP@38 .4wks gHTN LGA Almost at the end of 1st stage  Will dose epidural PCA, then discuss a redose w anesthesia if the PCA doesn't help with comfort; rec laboring down for now to allow cx to become completely dilated, and also allow the baby to descend as much as possible prior to pushing again  [name] [facility] 05/04/2020 7:35 PM

## 2020-05-04 NOTE — Progress Notes (Signed)
Sydney Kelley is a 20 y.o. G1P0 with IUP at 54w4dby  [facility]  Subjective: Doing well at this time. No current concerns.   Objective: BP 126/60   Pulse (!) 107   Temp 98.5 F (36.9 C) (Oral)   Resp 18   Ht _0  (1.626 m)   Wt 118.2 kg   LMP 08/08/2019 (Exact Date)   SpO2 98%   BMI 44.73 kg/m  No intake/output data recorded. Total I/O In: 3646.4 [P.O.:180; I.V.:3466.4] Out: 450 [Urine:450]   FHT:  FHR: 130 bpm, variability: moderate,  accelerations:  Present,  decelerations:  Absent UC: regular, every 2-3 min  SVE:   Dilation: 7.5 Effacement (%): 80,90 Station: -1 Exam by:: Dr CYsidro Evert Labs: Lab Results  Component Value Date   WBC 13.0 (H) 05/04/2020   HGB 9.7 (L) 05/04/2020   HCT 30.9 (L) 05/04/2020   MCV 76.3 (L) 05/04/2020   PLT 219 05/04/2020    Assessment / Plan: Induction of labor due to gestational hypertension,  progressing well on pitocin  Labor: Labor progressing well. S/p cytotec x2. Currently on pitocin. Difficulty monitoring contractions so IUPC placed. Contractions adequate at this time.  Fetal Wellbeing:  Category I Pain Control:  Received Epidural  I/D:  n/a  Anticipated MOD:  NSVD   gHTN: Diagnosed at prenatal visit on 05/03/20, sent here for induction. Denies headache, visual disturbances. No severe range pressures noted. Pre-E labs wnl. Will continue to monitor BP.  LGA fetus Rubella NI: MMR post-partum Microcytic anemia: Hgb stable at 9.7. Continue to monitor. Consider IV vs PO iron post-partum. LGSIL pap: due for repeat pap in August 2022   VGifford Shave MD  PGY-2, [facility]  05/04/20 5:51 PM

## 2020-05-04 NOTE — Anesthesia Preprocedure Evaluation (Signed)
Anesthesia Evaluation  Patient identified by MRN, date of birth, ID band Patient awake    Reviewed: Allergy & Precautions, H&P , NPO status , Patient's Chart, lab work & pertinent test results  History of Anesthesia Complications Negative for: history of anesthetic complications  Airway Mallampati: II  TM Distance: >3 FB Neck ROM: full    Dental no notable dental hx.    Pulmonary neg pulmonary ROS,    Pulmonary exam normal        Cardiovascular hypertension, Normal cardiovascular exam Rhythm:regular Rate:Normal     Neuro/Psych negative neurological ROS  negative psych ROS   GI/Hepatic negative GI ROS, Neg liver ROS,   Endo/Other  Morbid obesity  Renal/GU      Musculoskeletal   Abdominal   Peds  Hematology  (+) Blood dyscrasia, anemia ,   Anesthesia Other Findings   Reproductive/Obstetrics (+) Pregnancy                             Anesthesia Physical Anesthesia Plan  ASA: III  Anesthesia Plan: Epidural   Post-op Pain Management:    Induction:   PONV Risk Score and Plan:   Airway Management Planned:   Additional Equipment:   Intra-op Plan:   Post-operative Plan:   Informed Consent: I have reviewed the patients History and Physical, chart, labs and discussed the procedure including the risks, benefits and alternatives for the proposed anesthesia with the patient or authorized representative who has indicated his/her understanding and acceptance.       Plan Discussed with:   Anesthesia Plan Comments:         Anesthesia Quick Evaluation

## 2020-05-05 ENCOUNTER — Encounter (HOSPITAL_COMMUNITY): Payer: Self-pay | Admitting: Family Medicine

## 2020-05-05 LAB — CBC
HCT: 31.1 % — ABNORMAL LOW (ref 36.0–46.0)
Hemoglobin: 9.4 g/dL — ABNORMAL LOW (ref 12.0–15.0)
MCH: 23.2 pg — ABNORMAL LOW (ref 26.0–34.0)
MCHC: 30.2 g/dL (ref 30.0–36.0)
MCV: 76.8 fL — ABNORMAL LOW (ref 80.0–100.0)
Platelets: 202 10*3/uL (ref 150–400)
RBC: 4.05 MIL/uL (ref 3.87–5.11)
RDW: 16.7 % — ABNORMAL HIGH (ref 11.5–15.5)
WBC: 22.7 10*3/uL — ABNORMAL HIGH (ref 4.0–10.5)
nRBC: 0 % (ref 0.0–0.2)

## 2020-05-05 MED ORDER — ACETAMINOPHEN 325 MG PO TABS
650.0000 mg | ORAL_TABLET | Freq: Four times a day (QID) | ORAL | Status: DC
  Administered 2020-05-06: 650 mg via ORAL
  Filled 2020-05-05: qty 2

## 2020-05-05 MED ORDER — DIPHENHYDRAMINE HCL 25 MG PO CAPS
25.0000 mg | ORAL_CAPSULE | Freq: Four times a day (QID) | ORAL | Status: DC | PRN
Start: 2020-05-05 — End: 2020-05-06

## 2020-05-05 MED ORDER — ZOLPIDEM TARTRATE 5 MG PO TABS: 5.0000 mg | ORAL_TABLET | Freq: Every evening | ORAL | Status: DC | PRN

## 2020-05-05 MED ORDER — COCONUT OIL OIL: 1.0000 "application " | TOPICAL_OIL | Status: DC | PRN

## 2020-05-05 MED ORDER — PRENATAL MULTIVITAMIN CH
1.0000 | ORAL_TABLET | Freq: Every day | ORAL | Status: DC
  Administered 2020-05-05 – 2020-05-06 (×2): 1 via ORAL
  Filled 2020-05-05 (×2): qty 1

## 2020-05-05 MED ORDER — ONDANSETRON HCL 4 MG/2ML IJ SOLN
4.0000 mg | INTRAMUSCULAR | Status: DC | PRN
Start: 2020-05-05 — End: 2020-05-06

## 2020-05-05 MED ORDER — SENNOSIDES-DOCUSATE SODIUM 8.6-50 MG PO TABS
2.0000 | ORAL_TABLET | Freq: Every day | ORAL | Status: DC
  Administered 2020-05-05 – 2020-05-06 (×2): 2 via ORAL
  Filled 2020-05-05 (×2): qty 2

## 2020-05-05 MED ORDER — DIBUCAINE (PERIANAL) 1 % EX OINT: 1.0000 "application " | TOPICAL_OINTMENT | CUTANEOUS | Status: DC | PRN

## 2020-05-05 MED ORDER — TETANUS-DIPHTH-ACELL PERTUSSIS 5-2.5-18.5 LF-MCG/0.5 IM SUSY: 0.5000 mL | PREFILLED_SYRINGE | Freq: Once | INTRAMUSCULAR | Status: DC

## 2020-05-05 MED ORDER — SIMETHICONE 80 MG PO CHEW: 80.0000 mg | CHEWABLE_TABLET | ORAL | Status: DC | PRN

## 2020-05-05 MED ORDER — ONDANSETRON HCL 4 MG PO TABS: 4.0000 mg | ORAL_TABLET | ORAL | Status: DC | PRN

## 2020-05-05 MED ORDER — FERROUS SULFATE 325 (65 FE) MG PO TABS
325.0000 mg | ORAL_TABLET | ORAL | Status: DC
  Administered 2020-05-05: 325 mg via ORAL
  Filled 2020-05-05: qty 1

## 2020-05-05 MED ORDER — ACETAMINOPHEN 325 MG PO TABS
650.0000 mg | ORAL_TABLET | ORAL | Status: DC
  Administered 2020-05-05: 650 mg via ORAL
  Filled 2020-05-05: qty 2

## 2020-05-05 MED ORDER — IBUPROFEN 600 MG PO TABS
600.0000 mg | ORAL_TABLET | Freq: Four times a day (QID) | ORAL | Status: DC
  Administered 2020-05-05 – 2020-05-06 (×6): 600 mg via ORAL
  Filled 2020-05-05 (×7): qty 1

## 2020-05-05 MED ORDER — BENZOCAINE-MENTHOL 20-0.5 % EX AERO
1.0000 "application " | INHALATION_SPRAY | CUTANEOUS | Status: DC | PRN
  Administered 2020-05-05: 1 via TOPICAL
  Filled 2020-05-05: qty 56

## 2020-05-05 MED ORDER — WITCH HAZEL-GLYCERIN EX PADS: 1.0000 "application " | MEDICATED_PAD | CUTANEOUS | Status: DC | PRN

## 2020-05-05 NOTE — Lactation Note (Signed)
This note was copied from a baby's chart. Lactation Consultation Note  Patient Name: Sydney Kelley [id number] Date: 05/05/2020 Reason for consult: L&D Initial assessment;1st time breastfeeding;Early term 37-38.6wks Age:22 hours  ( L&D- no LC charge) P1, ETI female infant. LC entered the room, mom was doing STS with infant and infant was cuing to breastfeed. LC ask mom to do breast stimulation, prior to latching infant at the breast due to having flat nipples. Mom latched the infant on her right breast using the football hold, infant latched with depth and sustain latch, breastfeeding for 15 minutes. Mom knows to breastfeed infant according to primal cues: licking, kissing ,smacking, hands in mouth and rooting. Mom knows RN and LC on MBU can help assist her with latching infant at the breast. LC discussed input and out put with parents. LC will place hand pump in mom's room on MBU, mom understands to pre-pump breast prior to latching infant to help evert nipple shaft out more. LC will inform RN to assist mom with how to use hand pump.  Maternal Data Has patient been taught Hand Expression?: Yes Does the patient have breastfeeding experience prior to this delivery?: No  Feeding Mother's Current Feeding Choice: Breast Milk  LATCH Score Latch: Grasps breast easily, tongue down, lips flanged, rhythmical sucking.  Audible Swallowing: Spontaneous and intermittent  Type of Nipple: Flat  Comfort (Breast/Nipple): Soft / non-tender  Hold (Positioning): Assistance needed to correctly position infant at breast and maintain latch.  LATCH Score: 8   Lactation Tools Discussed/Used Tools: Pump Breast pump type: Manual Pump Education: Setup, frequency, and cleaning;Milk Storage Reason for Pumping: Mom will pre-pump breast prior to latching infant due to having flat nipples. Pumping frequency: Pre-pump pump breast to help evert nipple shaft out more.  Interventions Interventions: Breast  feeding basics reviewed;Breast compression;Assisted with latch;Adjust position;Hand pump;Skin to skin;Support pillows;Breast massage;Position options;Hand express;Expressed milk;Pre-pump if needed  Discharge Pump: Manual WIC Program: Yes  Consult Status Consult Status: Follow-up Date: 05/05/20 Follow-up type: In-patient    Danelle Earthly 05/05/2020, 12:57 AM

## 2020-05-05 NOTE — Lactation Note (Signed)
This note was copied from a baby's chart. Lactation Consultation Note  Patient Name: Sydney Kelley [id number] Date: 05/05/2020 Reason for consult: Mother's request;[place] Age:22 hours  Mom requested help with latching "Gabriel." Infant latched with ease using the teacup hold and swallows were audible to the naked ear. To improve infant's alignment, we placed Mom in a side-lying position. Infant was able to self-latch & the suck:swallow ratio was 1:1. Mom was comfortable with latch; I did note there was no elevation of tongue when infant cried.   Vicente Serene would latch for a couple of minutes, come off, and then relatch a few minutes later. I reassured Mom & educated her about the sleepy phase during the 1st 24 hours.   Mom is able to identify the sound of swallows. At this time, Mom does not need to pre-pump before latching or wear shells for nipple eversion. I did show Mom how to assemble and wear the shells if needed for nipple soreness.   Mom reports + breast changes w/pregnancy w/an increase in breast size of at least 2 cup sizes.   Parents were educated about being careful for Mom not to fall asleep while nursing/or resting with baby while in side-lying position, unless someone else is present in room to keep an eye on infant.   I made Gayla Medicus, RN, aware that if Mom needs further assistance, infant latches well with the teacup hold & that Mom does not need to pre-pump or wear shells prior to latching.   Lurline Hare [facility] 05/05/2020, 8:57 AM

## 2020-05-05 NOTE — Progress Notes (Signed)
POSTPARTUM PROGRESS NOTE  Post Partum Day 1  Subjective:  Sydney Kelley is a 20 y.o. G1P1001 s/p VD at [redacted]w[redacted]d  She reports she is doing well. No acute events overnight. She denies any problems with ambulating, voiding or po intake. Denies nausea or vomiting.  Pain is well controlled.  Lochia is a bit heavier than a period, not passing any clots.  Objective: Blood pressure 109/64, pulse (!) 102, temperature 98.4 F (36.9 C), temperature source Oral, resp. rate 16, height _0  (1.626 m), weight 118.2 kg, last menstrual period 08/08/2019, SpO2 100 %, unknown if currently breastfeeding.  Physical Exam:  General: alert, cooperative and no distress Chest: no respiratory distress Heart:regular rate, distal pulses intact Abdomen: soft, nontender,  Uterine Fundus: firm, appropriately tender DVT Evaluation: No calf swelling or tenderness Extremities: trace edema Skin: warm, dry  Recent Labs    05/04/20 0318 05/05/20 0151  HGB 9.7* 9.4*  HCT 30.9* 31.1*    Assessment/Plan: Sydney Endicottis a 20y.o. G1P1001 s/p VD at 371w4dfter IOL for gHTN.  PPD#1: Doing well.  Routine postpartum care #Contraception: Undecided, considering pills vs Nexplanon at pp visit #Feeding: Breast #Rubella NI: MMR pp #Anemia of pregnancy: PO iron every other day #LGSIL pap: Due for repeat pap in August 2022 #Dispo: Plan for discharge on 3/3.   LOS: 2 days   EmIan BushmanMD 05/05/2020, 7:36 AM

## 2020-05-05 NOTE — Anesthesia Postprocedure Evaluation (Signed)
Anesthesia Post Note  Patient: Alyiah Ulloa  Procedure(s) Performed: AN AD HOC LABOR EPIDURAL     Patient location during evaluation: Mother Baby Anesthesia Type: Epidural Level of consciousness: awake Pain management: satisfactory to patient Vital Signs Assessment: post-procedure vital signs reviewed and stable Respiratory status: spontaneous breathing Cardiovascular status: stable Anesthetic complications: no   No complications documented.  Last Vitals:  Vitals:   05/05/20 0221 05/05/20 0316  BP: 116/62 109/64  Pulse: 95 (!) 102  Resp: 18 16  Temp: 36.9 C 36.9 C  SpO2: 100% 100%    Last Pain:  Vitals:   05/05/20 0529  TempSrc:   PainSc: 0-No pain   Pain Goal: Patients Stated Pain Goal: 3 (05/04/20 2030)                 Cephus Shelling

## 2020-05-05 NOTE — Discharge Summary (Signed)
Postpartum Discharge Summary    Patient Name: Sydney Kelley DOB: November 22, 2000 MRN: [id number]  Date of admission: 05/03/2020 Delivery date:05/04/2020  Delivering provider: Randa Ngo  Date of discharge: 05/07/2020  Admitting diagnosis: Gestational hypertension, third trimester [O13.3] Intrauterine pregnancy: [redacted]w[redacted]d    Secondary diagnosis:  Principal Problem:   Vaginal delivery Active Problems:   LGSIL on Pap smear of cervix   Large for gestational age fetus affecting management of mother, antepartum, third trimester, fetus 1   Rubella non-immune status, antepartum   Anemia of pregnancy   Gestational hypertension, third trimester  Additional problems: as noted above   Discharge diagnosis: Term Pregnancy Delivered                                              Post partum procedures:none Augmentation: Pitocin, Cytotec and IP Foley Complications: None  Hospital course: Induction of Labor With Vaginal Delivery   20y.o. yo G1P0 at 37w5das admitted to the hospital 05/03/2020 for induction of labor.  Indication for induction: gHTN.  Patient had an uncomplicated labor course as follows: Membrane Rupture Time/Date: 11:22 AM ,05/04/2020   Delivery Method:Vaginal, Spontaneous  Episiotomy: None  Lacerations:  2nd degree  Details of delivery can be found in separate delivery note.  Patient had a routine postpartum course. Patient is discharged home 05/07/20.  Newborn Data: Birth date:05/04/2020  Birth time:11:44 PM  Gender:Female  Living status:Living  Apgars:8 ,9  [id number]   Magnesium Sulfate received: No BMZ received: No Rhophylac:N/A MMXVQ:MGQQPYPMR prior to discharge T-DaP:offered prior to discharge Flu: offered prior to discharge Transfusion:No  Physical exam  Vitals:   05/05/20 1108 05/05/20 1523 05/05/20 2231 05/06/20 0512  BP: 126/76 121/69 132/77 126/76  Pulse: 72 76 97 83  Resp: 16 16 16 16   Temp: 97.7 F (36.5 C) 98.3 F (36.8 C) 97.9 F (36.6 C)  98 F (36.7 C)  TempSrc: Axillary Axillary Oral Oral  SpO2: 100% 100% 99% 98%  Weight:      Height:       General: alert, cooperative and no distress Lochia: appropriate Uterine Fundus: firm Incision: N/A DVT Evaluation: No evidence of DVT seen on physical exam. No cords or calf tenderness. No significant calf/ankle edema. Labs: Lab Results  Component Value Date   WBC 22.7 (H) 05/05/2020   HGB 9.4 (L) 05/05/2020   HCT 31.1 (L) 05/05/2020   MCV 76.8 (L) 05/05/2020   PLT 202 05/05/2020   CMP Latest Ref Rng & Units 05/03/2020  Glucose 70 - 99 mg/dL 89  BUN 6 - 20 mg/dL 10  Creatinine 0.44 - 1.00 mg/dL 0.55  Sodium 135 - 145 mmol/L 135  Potassium 3.5 - 5.1 mmol/L 4.3  Chloride 98 - 111 mmol/L 106  CO2 22 - 32 mmol/L 19(L)  Calcium 8.9 - 10.3 mg/dL 9.1  Total Protein 6.5 - 8.1 g/dL 6.0(L)  Total Bilirubin 0.3 - 1.2 mg/dL 0.4  Alkaline Phos 38 - 126 U/L 168(H)  AST 15 - 41 U/L 23  ALT 0 - 44 U/L 13   Edinburgh Score: Edinburgh Postnatal Depression Scale Screening Tool 05/06/2020  I have been able to laugh and see the funny side of things. 0  I have looked forward with enjoyment to things. 0  I have blamed myself unnecessarily when things went wrong. 1  I have been  anxious or worried for no good reason. 1  I have felt scared or panicky for no good reason. 0  Things have been getting on top of me. 0  I have been so unhappy that I have had difficulty sleeping. 0  I have felt sad or miserable. 0  I have been so unhappy that I have been crying. 0  The thought of harming myself has occurred to me. 0  Edinburgh Postnatal Depression Scale Total 2     After visit meds:  Allergies as of 05/06/2020   No Known Allergies     Medication List    STOP taking these medications   aspirin EC 81 MG tablet     TAKE these medications   acetaminophen 500 MG tablet Commonly known as: TYLENOL Take 1,000 mg by mouth every 6 (six) hours as needed for headache or mild pain.   coconut  oil Oil Apply 1 application topically as needed.   Comfort Fit Maternity Supp Med Misc 1 Device by Does not apply route daily.   Evening Primrose Oil 1000 MG Caps Take 2 capsules by mouth at bedtime. Patient takes one capsule by mouth and places one capsule vaginally at bedtime.   ferrous sulfate 325 (65 FE) MG tablet Commonly known as: FerrouSul Take 1 tablet (325 mg total) by mouth every other day.   ibuprofen 600 MG tablet Commonly known as: ADVIL Take 1 tablet (600 mg total) by mouth every 6 (six) hours.   Prenatal Complete 14-0.4 MG Tabs Take 1 tablet by mouth daily.        Discharge home in stable condition Infant Feeding: Breast Infant Disposition:home with mother Discharge instruction: per After Visit Summary and Postpartum booklet. Activity: Advance as tolerated. Pelvic rest for 6 weeks.  Diet: routine diet Future Appointments: Future Appointments  Date Time Provider [place]  06/15/2020  2:00 PM Burleson, Rona Ravens, NP [place] None   Follow up Visit: Message sent to [facility] by Dr. Astrid Drafts.  Please schedule this patient for a In person postpartum visit in 6 weeks with the following provider: Any provider. Additional Postpartum F/U:BP check 1 week and pap at postpartum appt  High risk pregnancy complicated by: gHTN, rubella non-immune, anemia Delivery mode:  Vaginal, Spontaneous  Anticipated Birth Control:  Unsure  05/07/2020 Randa Ngo, MD

## 2020-05-05 NOTE — Discharge Instructions (Signed)

## 2020-05-06 MED ORDER — COCONUT OIL OIL: 1.0000 "application " | TOPICAL_OIL | 0 refills | Status: DC | PRN

## 2020-05-06 MED ORDER — MEASLES, MUMPS & RUBELLA VAC IJ SOLR
0.5000 mL | Freq: Once | INTRAMUSCULAR | Status: AC
  Administered 2020-05-06: 0.5 mL via SUBCUTANEOUS
  Filled 2020-05-06: qty 0.5

## 2020-05-06 MED ORDER — IBUPROFEN 600 MG PO TABS: 600.0000 mg | ORAL_TABLET | Freq: Four times a day (QID) | ORAL | 0 refills | Status: DC

## 2020-05-06 NOTE — Lactation Note (Signed)
This note was copied from a baby's chart. Lactation Consultation Note  Patient Name: Sydney Kelley [id number] Date: 05/06/2020 Reason for consult: Follow-up assessment Age:22 hours   Feeding Mother's Current Feeding Choice: Breast Milk    Interventions Interventions: Breast feeding basics reviewed;Education  Discharge Discharge Education: Engorgement and breast care;Warning signs for feeding baby  Consult Status Consult Status: Complete Date: 05/06/20   Dahlia Byes [facility] 05/06/2020, 9:43 AM

## 2020-05-10 ENCOUNTER — Encounter: Payer: Medicaid Other | Admitting: Advanced Practice Midwife

## 2020-05-11 ENCOUNTER — Ambulatory Visit: Payer: Medicaid Other

## 2020-06-15 ENCOUNTER — Ambulatory Visit (INDEPENDENT_AMBULATORY_CARE_PROVIDER_SITE_OTHER): Payer: Medicaid Other | Admitting: Nurse Practitioner

## 2020-06-15 ENCOUNTER — Encounter: Payer: Self-pay | Admitting: Nurse Practitioner

## 2020-06-15 ENCOUNTER — Other Ambulatory Visit: Payer: Self-pay

## 2020-06-15 DIAGNOSIS — Z975 Presence of (intrauterine) contraceptive device: Secondary | ICD-10-CM | POA: Insufficient documentation

## 2020-06-15 DIAGNOSIS — Z30017 Encounter for initial prescription of implantable subdermal contraceptive: Secondary | ICD-10-CM | POA: Diagnosis not present

## 2020-06-15 DIAGNOSIS — Z6839 Body mass index (BMI) 39.0-39.9, adult: Secondary | ICD-10-CM | POA: Diagnosis not present

## 2020-06-15 LAB — POCT URINE PREGNANCY: Preg Test, Ur: NEGATIVE

## 2020-06-15 MED ORDER — ETONOGESTREL 68 MG ~~LOC~~ IMPL
68.0000 mg | DRUG_IMPLANT | Freq: Once | SUBCUTANEOUS | Status: AC
  Administered 2020-06-15: 68 mg via SUBCUTANEOUS

## 2020-06-15 NOTE — Patient Instructions (Signed)
Exercising to Stay Healthy To become healthy and stay healthy, it is recommended that you do moderate-intensity and vigorous-intensity exercise. You can tell that you are exercising at a moderate intensity if your heart starts beating faster and you start breathing faster but can still hold a conversation. You can tell that you are exercising at a vigorous intensity if you are breathing much harder and faster and cannot hold a conversation while exercising. Exercising regularly is important. It has many health benefits, such as:  Improving overall fitness, flexibility, and endurance.  Increasing bone density.  Helping with weight control.  Decreasing body fat.  Increasing muscle strength.  Reducing stress and tension.  Improving overall health. How often should I exercise? Choose an activity that you enjoy, and set realistic goals. Your health care provider can help you make an activity plan that works for you. Exercise regularly as told by your health care provider. This may include:  Doing strength training two times a week, such as: ? Lifting weights. ? Using resistance bands. ? Push-ups. ? Sit-ups. ? Yoga.  Doing a certain intensity of exercise for a given amount of time. Choose from these options: ? A total of 150 minutes of moderate-intensity exercise every week. ? A total of 75 minutes of vigorous-intensity exercise every week. ? A mix of moderate-intensity and vigorous-intensity exercise every week. Children, pregnant women, people who have not exercised regularly, people who are overweight, and older adults may need to talk with a health care provider about what activities are safe to do. If you have a medical condition, be sure to talk with your health care provider before you start a new exercise program. What are some exercise ideas? Moderate-intensity exercise ideas include:  Walking 1 mile (1.6 km) in about 15  minutes.  Biking.  Hiking.  Golfing.  Dancing.  Water aerobics. Vigorous-intensity exercise ideas include:  Walking 4.5 miles (7.2 km) or more in about 1 hour.  Jogging or running 5 miles (8 km) in about 1 hour.  Biking 10 miles (16.1 km) or more in about 1 hour.  Lap swimming.  Roller-skating or in-line skating.  Cross-country skiing.  Vigorous competitive sports, such as football, basketball, and soccer.  Jumping rope.  Aerobic dancing.   What are some everyday activities that can help me to get exercise?  Yard work, such as: ? Pushing a lawn mower. ? Raking and bagging leaves.  Washing your car.  Pushing a stroller.  Shoveling snow.  Gardening.  Washing windows or floors. How can I be more active in my day-to-day activities?  Use stairs instead of an elevator.  Take a walk during your lunch break.  If you drive, park your car farther away from your work or school.  If you take public transportation, get off one stop early and walk the rest of the way.  Stand up or walk around during all of your indoor phone calls.  Get up, stretch, and walk around every 30 minutes throughout the day.  Enjoy exercise with a friend. Support to continue exercising will help you keep a regular routine of activity. What guidelines can I follow while exercising?  Before you start a new exercise program, talk with your health care provider.  Do not exercise so much that you hurt yourself, feel dizzy, or get very short of breath.  Wear comfortable clothes and wear shoes with good support.  Drink plenty of water while you exercise to prevent dehydration or heat stroke.  Work out until   your breathing and your heartbeat get faster. Where to find more information  [place] Department of Health and Human Services: www.hhs.gov  Centers for Disease Control and Prevention (CDC): www.cdc.gov Summary  Exercising regularly is important. It will improve your overall fitness,  flexibility, and endurance.  Regular exercise also will improve your overall health. It can help you control your weight, reduce stress, and improve your bone density.  Do not exercise so much that you hurt yourself, feel dizzy, or get very short of breath.  Before you start a new exercise program, talk with your health care provider. This information is not intended to replace advice given to you by your health care provider. Make sure you discuss any questions you have with your health care provider. Document Revised: 02/02/2017 Document Reviewed: 01/11/2017 Elsevier Patient Education  2021 Elsevier Inc.  

## 2020-06-15 NOTE — Progress Notes (Signed)
Post Partum Visit Note  Sydney Kelley is a 20 y.o. G63P1001 female who presents for a postpartum visit. She is 6 weeks postpartum following a normal spontaneous vaginal delivery.  I have fully reviewed the prenatal and intrapartum course. The delivery was at 38 gestational weeks.  Anesthesia: epidural. Postpartum course has been unremarkable. Baby is doing well. Baby is feeding by bottle - Enfamil . Bleeding staining only. Bowel function is normal. Bladder function is normal. Patient is sexually active. Used Condom Contraception method is none. Postpartum depression screening: negative. EPDS= 0   The pregnancy intention screening data noted above was reviewed. Potential methods of contraception were discussed. The patient elected to proceed with Hormonal Implant.    Edinburgh Postnatal Depression Scale - 06/15/20 1419      Edinburgh Postnatal Depression Scale:  In the Past 7 Days   I have been able to laugh and see the funny side of things. 0    I have looked forward with enjoyment to things. 0    I have blamed myself unnecessarily when things went wrong. 0    I have been anxious or worried for no good reason. 0    I have felt scared or panicky for no good reason. 0    Things have been getting on top of me. 0    I have been so unhappy that I have had difficulty sleeping. 0    I have felt sad or miserable. 0    I have been so unhappy that I have been crying. 0    The thought of harming myself has occurred to me. 0    Edinburgh Postnatal Depression Scale Total 0            The following portions of the patient's history were reviewed and updated as appropriate: allergies, current medications, past family history, past medical history, past social history, past surgical history and problem list.  Review of Systems Pertinent items noted in HPI and remainder of comprehensive ROS otherwise negative.  Objective:  BP 118/71   Pulse 99   Ht 5\' 4"  (1.626 m)   Wt 230 lb  (104.3 kg)   Breastfeeding No   BMI 39.48 kg/m    General:  alert, cooperative and no distress   Breasts:  deferred  Lungs: clear to auscultation bilaterally  Heart:  regular rate and rhythm, S1, S2 normal, no murmur, click, rub or gallop  Abdomen: soft, nontender   Vulva:  pelvic deferred  Vagina:   Cervix:    Corpus:   Adnexa:    Rectal Exam:            OFFICE PROCEDURE NOTE  Avary Eichenberger is a 20 y.o. G1P1001 here for Nexplanon insertion.   Nexplanon Insertion Patient identified, informed consent performed, consent signed.   Appropriate time out taken. Nexplanon site identified.  Area prepped in usual sterile fashon. Three ml of 1% lidocaine was used to anesthetize the area.   Nexplanon inserted by company guidelines.  Patient and provider palpated the rod in her left innner upper arm.   A pressure bandage was applied to reduce any bruising.  The patient tolerated the procedure well and was given post procedure instructions.  Patient is planning to use condoms for 2 weeks as back up contraception.  Assessment:    Normal postpartum exam. Pap smear not done at today's visit.  Pap needed in September 2022. Plan:   Essential components of care per ACOG recommendations:  1.  Mood and well being: Patient with negative depression screening today. Reviewed local resources for support.  - Patient does not use tobacco.  - hx of drug use? No    2. Infant care and feeding:  -Patient currently breastmilk feeding? No  -Social determinants of health (SDOH) reviewed in EPIC. No concerns  3. Sexuality, contraception and birth spacing - Patient does not want a pregnancy in the next year.   - Reviewed forms of contraception in tiered fashion. Patient desired Nexplanon today.   - Discussed birth spacing of 18 months  4. Sleep and fatigue -Encouraged family/partner/community support of 4 hrs of uninterrupted sleep to help with mood and fatigue  5. Physical Recovery  -  Discussed patients delivery and complications - Patient had a 2nd degree laceration, perineal healing reviewed. Patient expressed understanding - Patient has urinary incontinence? No  - Patient is safe to resume physical and sexual activity  6.  Health Maintenance - Last pap smear done 10/28/2019 and was LSIL  Needs pap in September 2022 Advised weight loss to continue and to increase exercise. Advised to eat healthy foods and drink at least 8 8-oz glasses of water every day.  7. Chronic Disease - PCP follow up  Currie Paris, NP [facility] [employer], [facility]

## 2020-12-08 IMAGING — US US MFM OB DETAIL+14 WK
1 series · 12 of 28 positions shown · non-contrast
Comparison: none

[Series 1: us mfm ob detail+14 wk · 120 acquisitions, 12 frames shown]
[im 5/120]
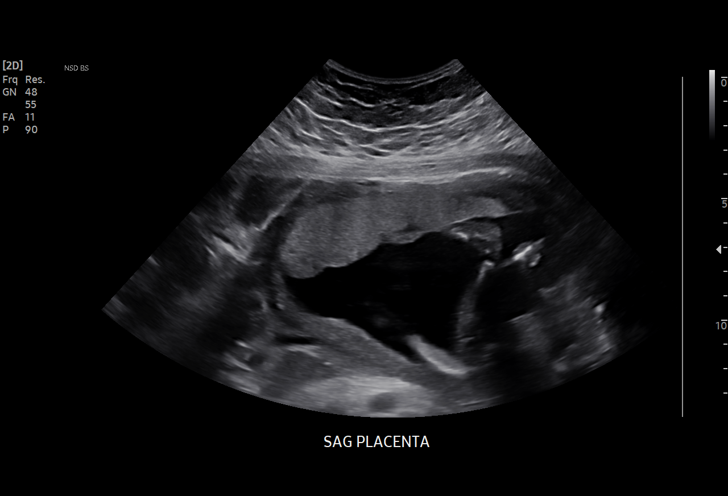
[im 14/120]
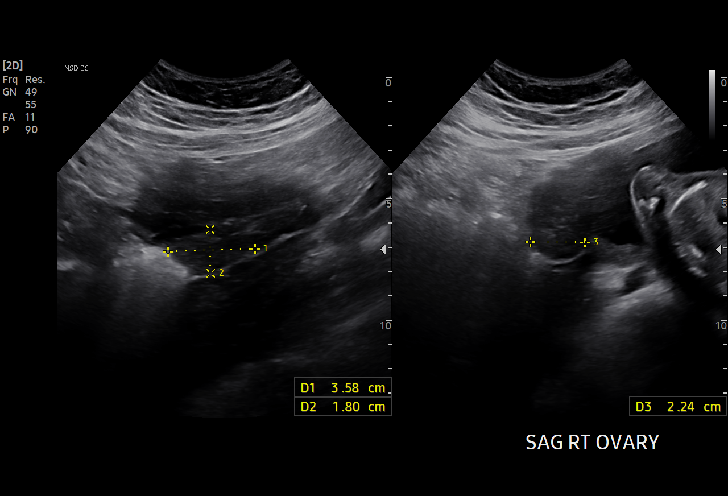
[im 23/120]
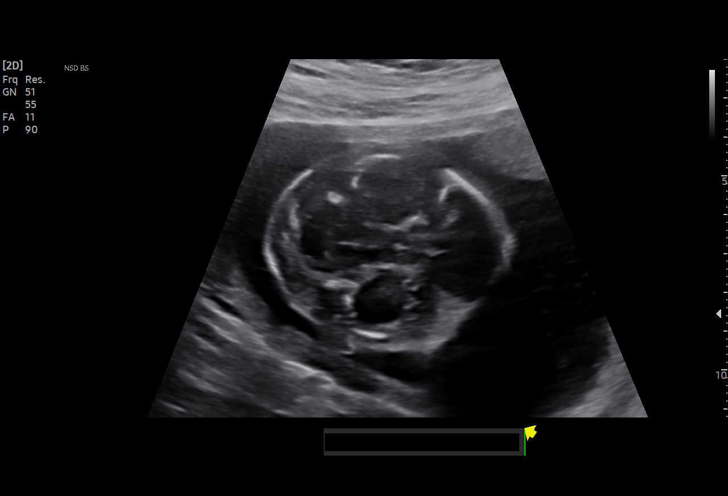
[im 36/120]
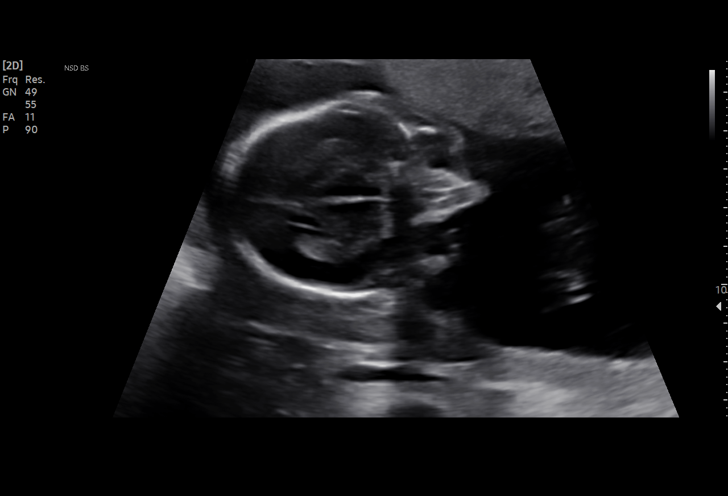
[im 45/120]
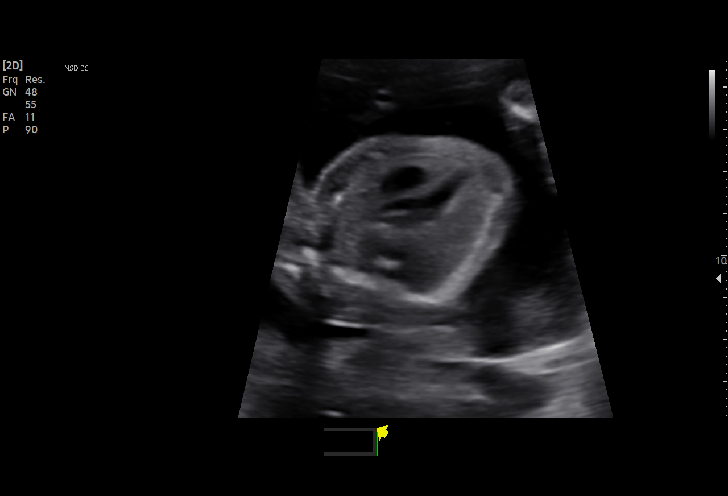
[im 53/120]
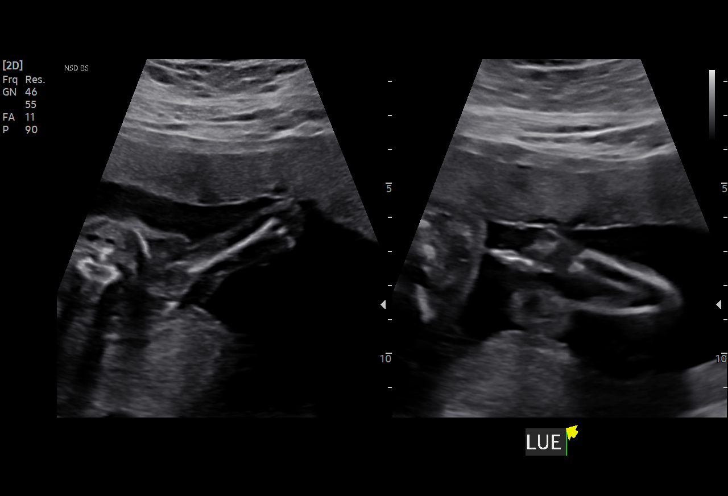
[im 67/120]
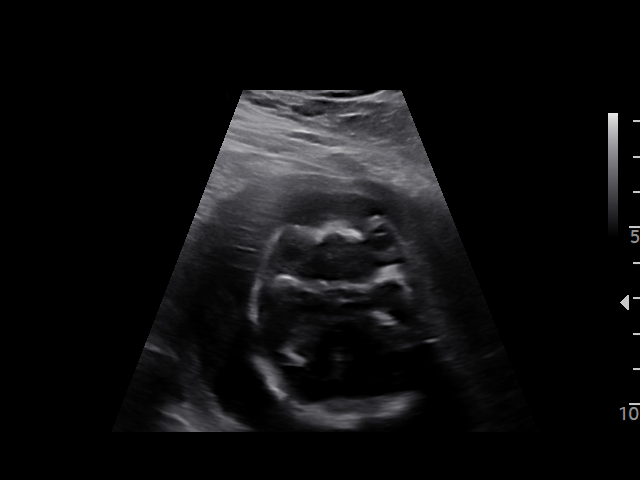
[im 75/120]
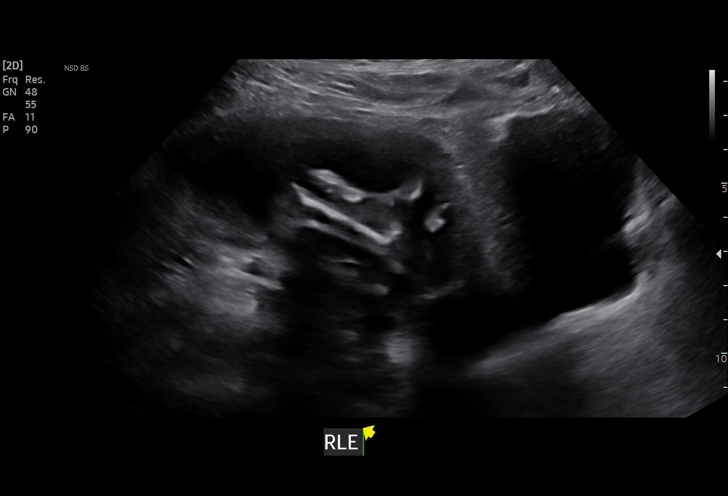
[im 84/120]
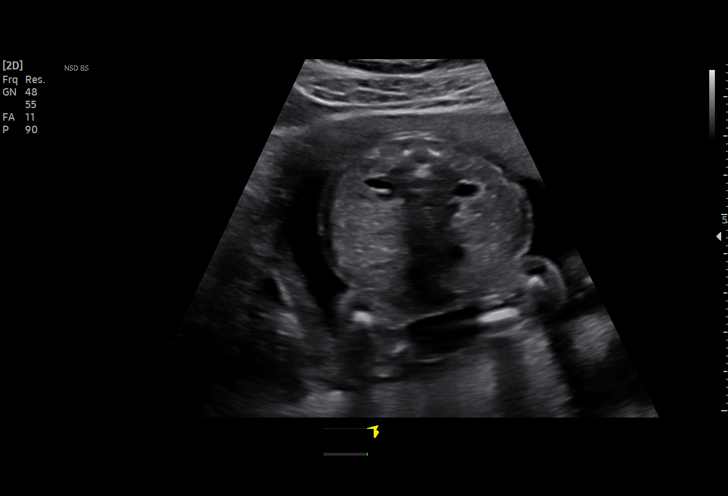
[im 97/120]
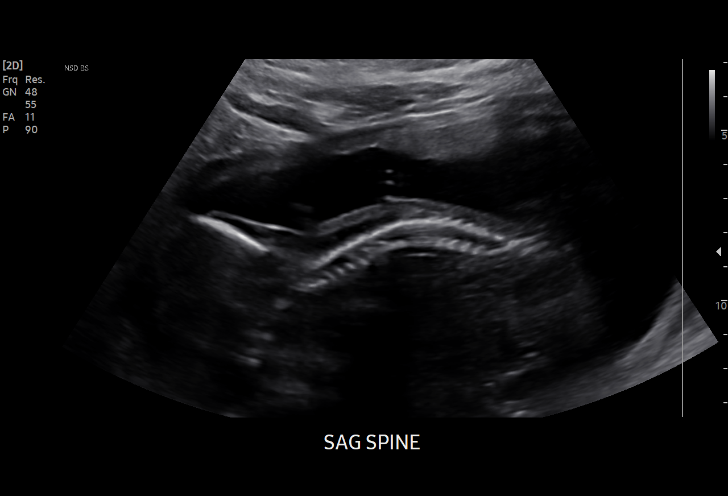
[im 106/120]
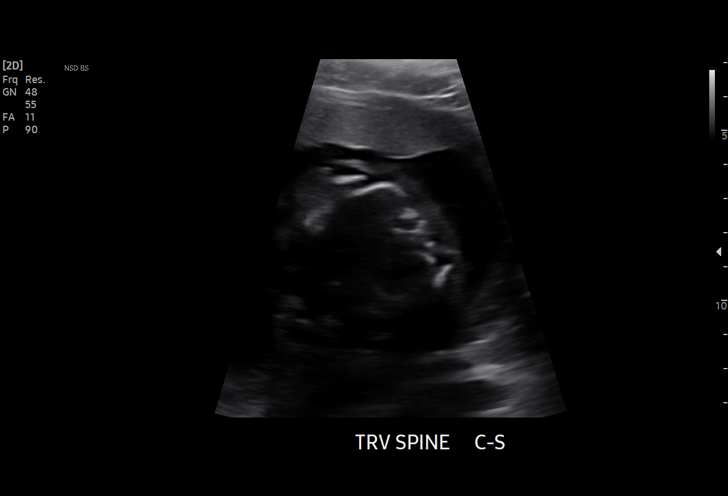
[im 115/120]
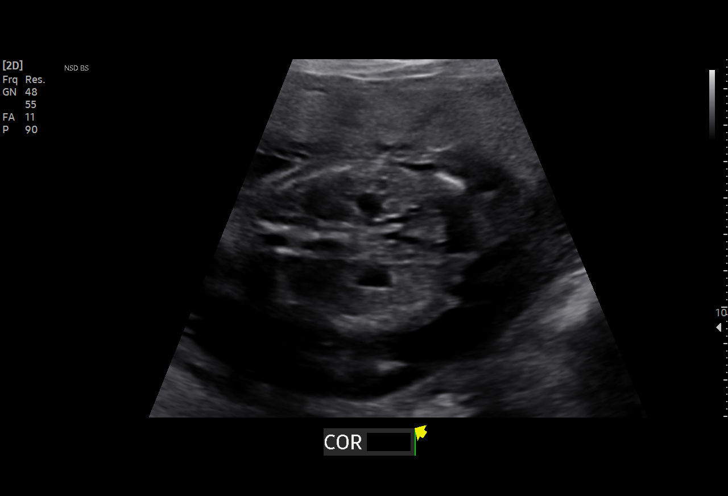

[12 of 28 positions shown; findings below may reference images not displayed]

Indications

 Obesity complicating pregnancy, second
 trimester (BMI 30.9)
 Encounter for antenatal screening for
 malformations (LR NIPS, NEG Horizon, NEG
 AFP)
 19 weeks gestation of pregnancy
 Medical complication of pregnancy (Rubella
 Non-Immune)
Fetal Evaluation

 Num Of Fetuses:         1
 Fetal Heart Rate(bpm):  145
 Cardiac Activity:       Observed
 Presentation:           Breech
 Placenta:               Anterior Fundal
 P. Cord Insertion:      Visualized

 Amniotic Fluid
 AFI FV:      Within normal limits

                             Largest Pocket(cm)

Biometry
 BPD:        50  mm     G. Age:  21w 1d         94  %    CI:        75.63   %    70 - 86
                                                         FL/HC:      17.0   %    16.8 -
 HC:      182.3  mm     G. Age:  20w 4d         82  %    HC/AC:      1.13        1.09 -
 AC:      161.6  mm     G. Age:  21w 2d         88  %    FL/BPD:     61.8   %
 FL:       30.9  mm     G. Age:  19w 4d         37  %    FL/AC:      19.1   %    20 - 24
 HUM:      30.8  mm     G. Age:  20w 2d         65  %
 CER:      22.4  mm     G. Age:  21w 0d         95  %
 NFT:       4.2  mm

 LV:        6.3  mm
 CM:        5.8  mm

 Est. FW:     359  gm    0 lb 13 oz      87  %
OB History

 Gravidity:    1         Term:   0        Prem:   0        SAB:   0
 TOP:          0       Ectopic:  0        Living: 0
Gestational Age

 LMP:           19w 5d        Date:  08/08/19                 EDD:   05/14/20
 U/S Today:     20w 5d                                        EDD:   05/07/20
 Best:          19w 5d     Det. By:  LMP  (08/08/19)          EDD:   05/14/20
Anatomy

 Cranium:               Appears normal         LVOT:                   Appears normal
 Cavum:                 Appears normal         Aortic Arch:            Appears normal
 Ventricles:            Appears normal         Ductal Arch:            Appears normal
 Choroid Plexus:        Appears normal         Diaphragm:              Appears normal
 Cerebellum:            Appears normal         Stomach:                Appears normal, left
                                                                       sided
 Posterior Fossa:       Appears normal         Abdomen:                Appears normal
 Nuchal Fold:           Appears normal         Abdominal Wall:         Appears nml (cord
                                                                       insert, abd wall)
 Face:                  Appears normal         Cord Vessels:           Appears normal (3
                        (orbits and profile)                           vessel cord)
 Lips:                  Appears normal         Kidneys:                Left sided
                                                                       pyelectasis,
                                                                       mm
 Palate:                Appears normal         Bladder:                Appears normal
 Thoracic:              Appears normal         Spine:                  Appears normal
 Heart:                 Appears normal         Upper Extremities:      Appears normal
                        (4CH, axis, and
                        situs)
 RVOT:                  Appears normal         Lower Extremities:      Appears normal

 Other:  Fetus appears to be a male. Heels and 5th digit visualized. Nasal
         bone visualized.
Cervix Uterus Adnexa

 Cervix
 Length:           4.24  cm.
 Normal appearance by transabdominal scan.
 Right Ovary
 Within normal limits.

 Left Ovary
 Within normal limits.

 Adnexa
 No abnormality visualized.
Comments

 This patient was seen for a detailed fetal anatomy scan due
 to maternal obesity.
 She denies any significant past medical history and denies
 any problems in her current pregnancy.
 She had a cell free DNA test earlier in her pregnancy which
 indicated a low risk for trisomy 21, 18, and 13. A male fetus is
 predicted.
 She was informed that the fetal growth and amniotic fluid
 level were appropriate for her gestational age.
 Mild left pyelectasis measuring  0.4 cm dilated was noted on
 today's ultrasound exam.  The implications and management
 of pylectasis was discussed with the patient. She was
 advised that the pyelectasis noted on prenatal ultrasounds
 will often resolve spontaneously after birth.  However, there
 may also be other processes such as an obstruction or reflux
 causing this finding that may require treatment after birth.
 She was advised that we will continue to follow her closely to
 assess this finding.  Should the renal pelvis continue to
 enlarge, we will consider a referral to pediatric
 urology/nephrology for a prenatal consultation.
 The small association between pyelectasis and Down
 syndrome was discussed.  She was offered and declined an
 amniocentesis today for definitive diagnosis of fetal
 aneuploidy.  She was reassured by the low risk for Down
 syndrome indicated by her cell free DNA test.
 The patient was informed that anomalies may be missed due
 to technical limitations. If the fetus is in a suboptimal position
 or maternal habitus is increased, visualization of the fetus in
 the maternal uterus may be impaired.
 A follow-up exam was scheduled in 5 weeks to reassess the
 fetal kidneys.

## 2021-03-11 IMAGING — US US MFM OB FOLLOW-UP
1 series · 14 of 24 positions shown · non-contrast
Comparison: none

[Series 1: us mfm ob follow-up · 24 acquisitions, 14 frames shown]
[im 1/24]
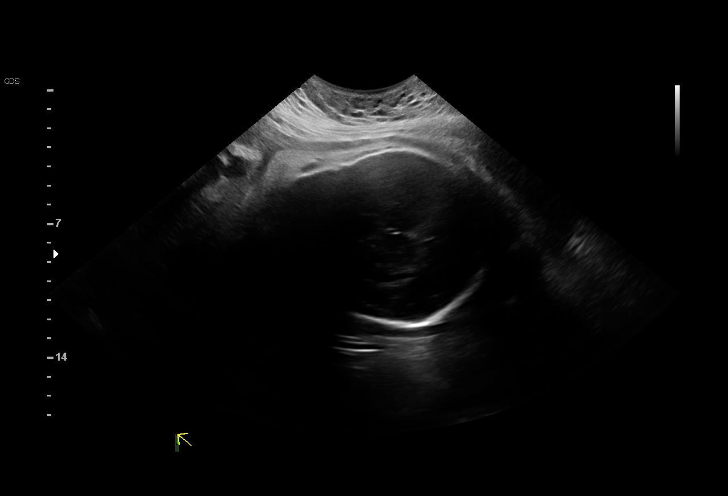
[im 3/24]
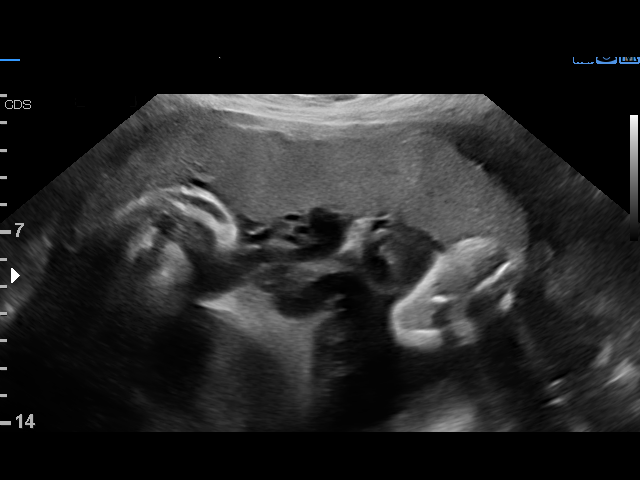
[im 5/24]
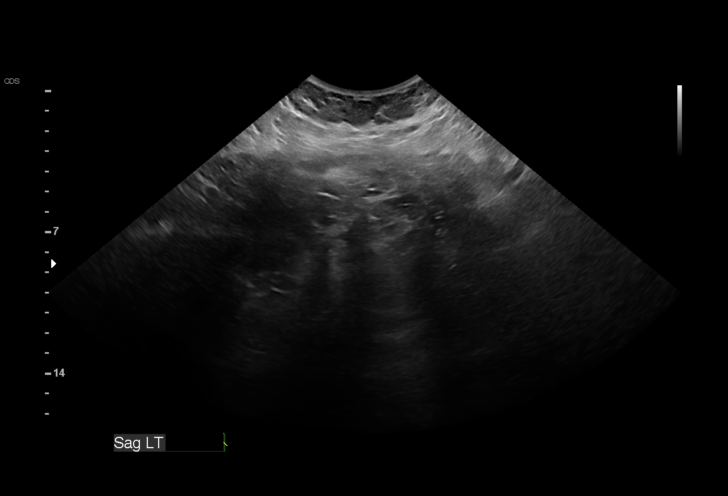
[im 7/24]
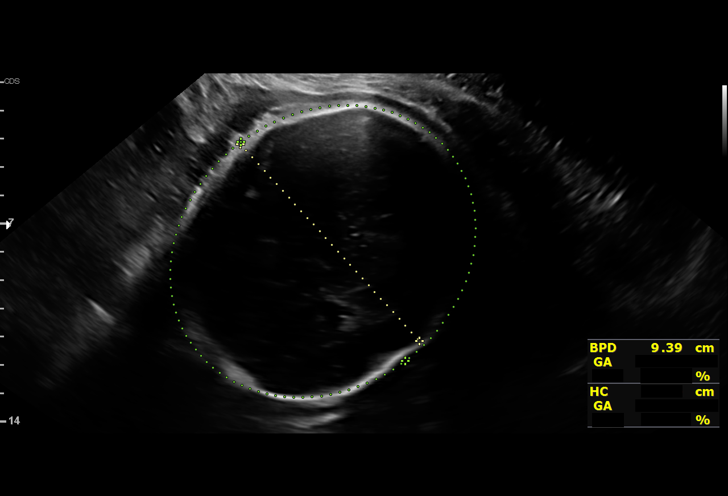
[im 8/24]
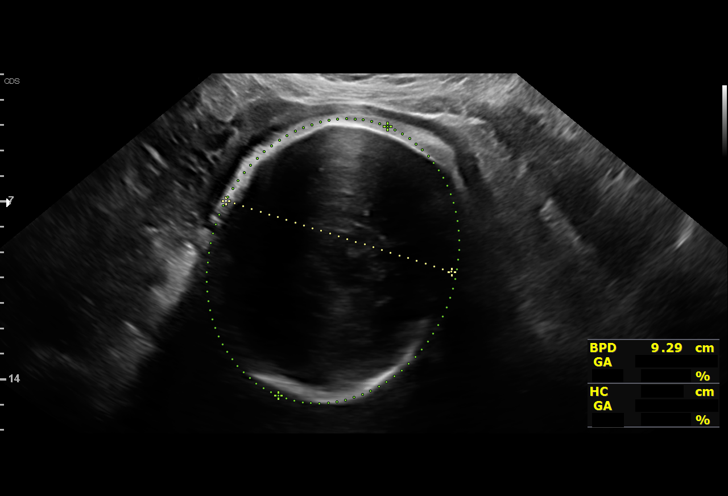
[im 10/24]
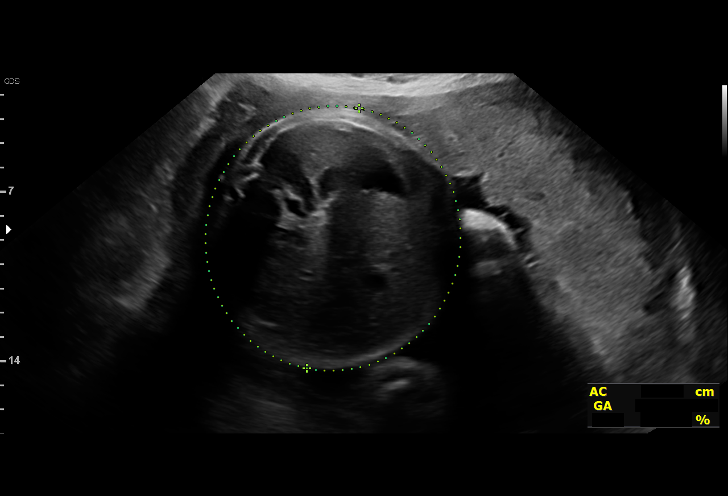
[im 12/24]
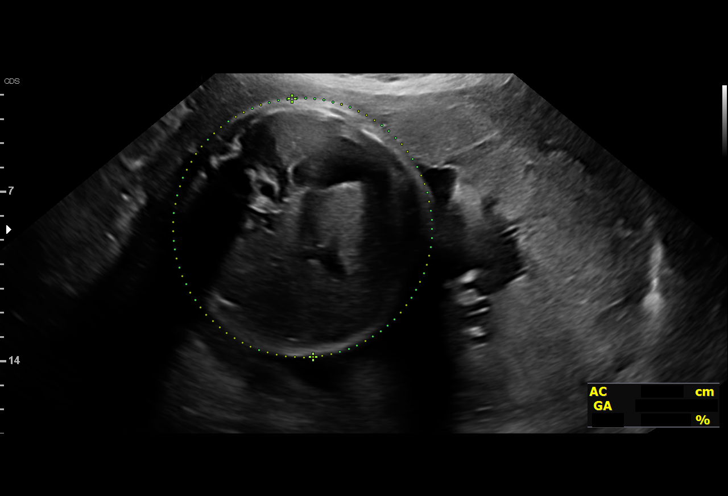
[im 13/24]
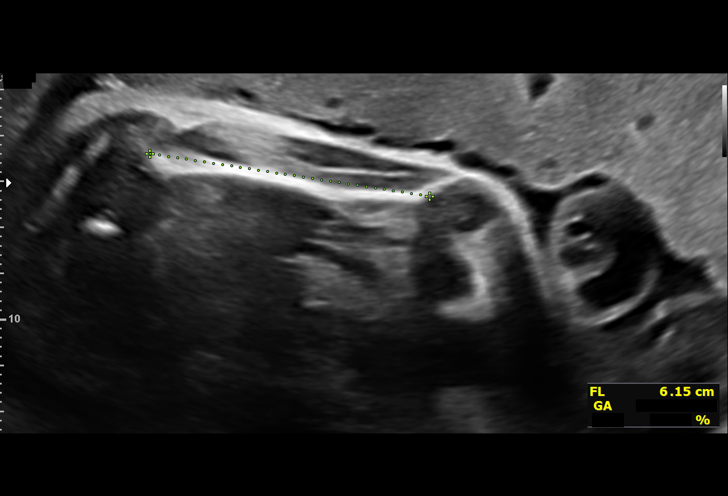
[im 15/24]
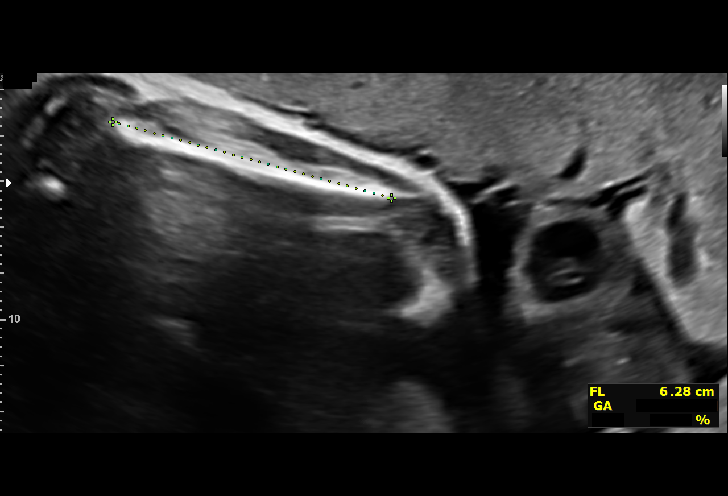
[im 17/24]
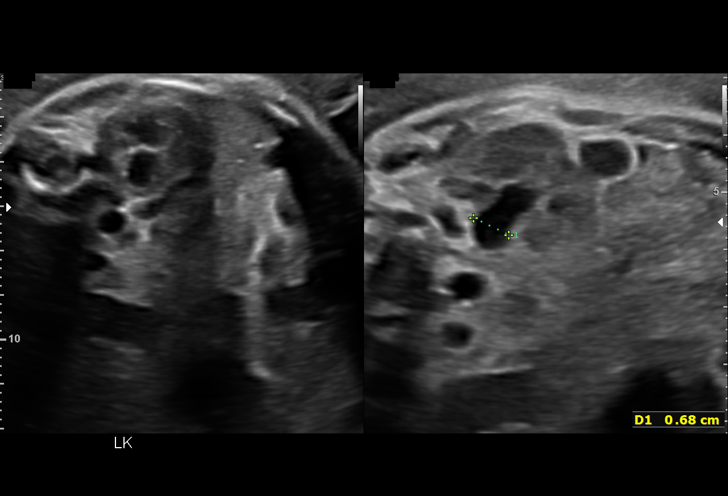
[im 19/24]
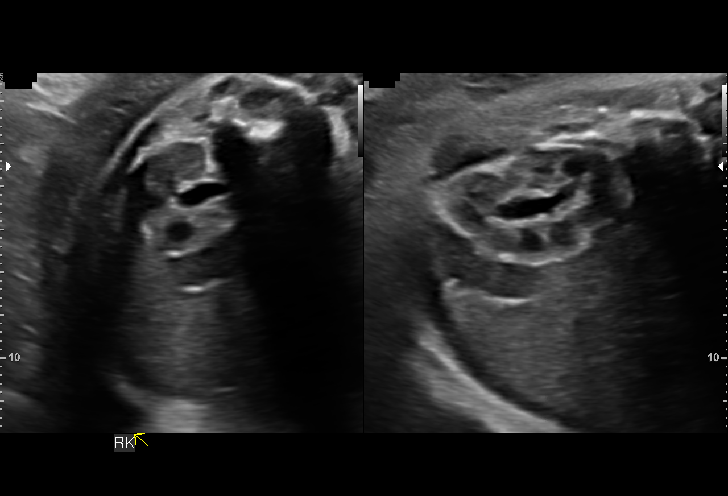
[im 20/24]
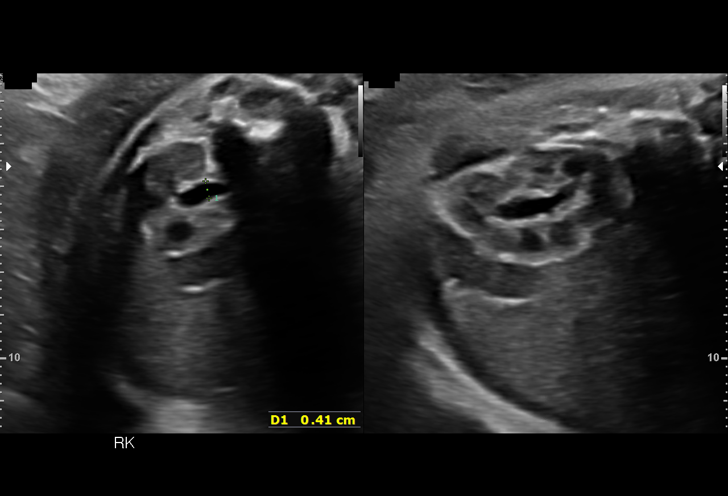
[im 22/24]
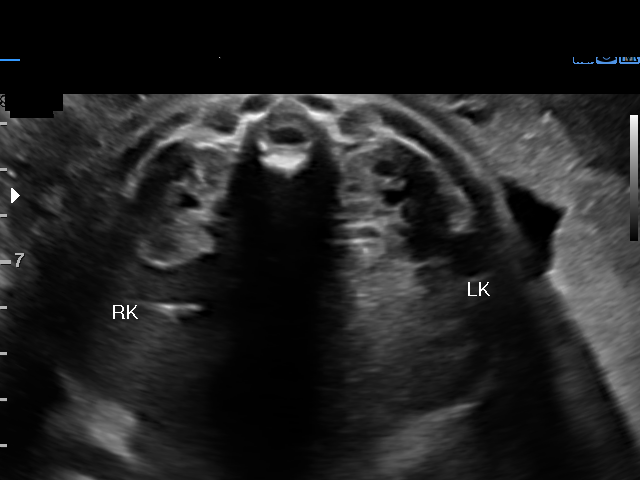
[im 24/24]
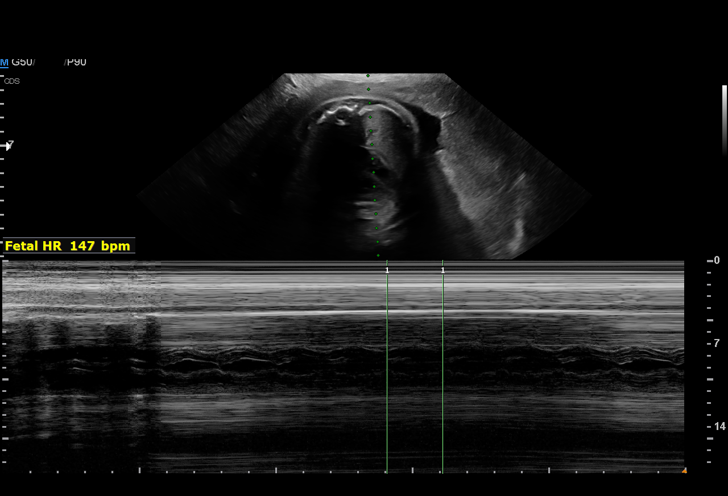

[14 of 24 positions shown; findings below may reference images not displayed]

KRISHNA

Indications

 33 weeks gestation of pregnancy
 Obesity complicating pregnancy, second
 trimester (BMI 30.9)
 Medical complication of pregnancy (Rubella
 Non-Immune)
 Pyelectasis of fetus on prenatal ultrasound
Fetal Evaluation

 Num Of Fetuses:         1
 Fetal Heart Rate(bpm):  147
 Cardiac Activity:       Observed
 Presentation:           Cephalic
 Placenta:               Anterior
 P. Cord Insertion:      Previously Visualized

 Amniotic Fluid
 AFI FV:      Within normal limits

 AFI Sum(cm)     %Tile       Largest Pocket(cm)
 15.3            54

 RUQ(cm)       RLQ(cm)       LUQ(cm)        LLQ(cm)

Biometry
 BPD:      90.7  mm     G. Age:  36w 5d       > 99  %    CI:        76.19   %    70 - 86
                                                         FL/HC:      18.9   %    19.9 -
 HC:      329.3  mm     G. Age:  37w 3d         98  %    HC/AC:      0.98        0.96 -
 AC:      335.3  mm     G. Age:  37w 3d       > 99  %    FL/BPD:     68.7   %    71 - 87
 FL:       62.3  mm     G. Age:  32w 2d         20  %    FL/AC:      18.6   %    20 - 24

 Est. FW:    8437  gm      6 lb 5 oz   > 99  %
OB History

 Gravidity:    1         Term:   0        Prem:   0        SAB:   0
 TOP:          0       Ectopic:  0        Living: 0
Gestational Age

 LMP:           33w 0d        Date:  08/08/19                 EDD:   05/14/20
 U/S Today:     36w 0d                                        EDD:   04/23/20
 Best:          33w 0d     Det. By:  LMP  (08/08/19)          EDD:   05/14/20
Anatomy

 Cranium:               Appears normal         LVOT:                   Previously seen
 Cavum:                 Previously seen        Aortic Arch:            Previously seen
 Ventricles:            Previously seen        Ductal Arch:            Previously seen
 Choroid Plexus:        Previously seen        Diaphragm:              Appears normal
 Cerebellum:            Previously seen        Stomach:                Appears normal, left
                                                                       sided
 Posterior Fossa:       Previously seen        Abdomen:                Appears normal
 Nuchal Fold:           Previously seen        Abdominal Wall:         Previously seen
 Face:                  Orbits and profile     Cord Vessels:           Previously seen
                        previously seen
 Lips:                  Previously seen        Kidneys:                Appear normal
 Palate:                Previously seen        Bladder:                Appears normal
 Thoracic:              Appears normal         Spine:                  Previously seen
 Heart:                 Previously seen        Upper Extremities:      Previously seen
 RVOT:                  Previously seen        Lower Extremities:      Previously seen

 Other:  Fetus appears to be a male. Heels and 5th digit prev visualized.
         Nasal bone prev visualized.
Cervix Uterus Adnexa

 Cervix
 Not visualized (advanced GA >82wks)

 Uterus
 No abnormality visualized.

 Right Ovary
 Not visualized.

 Left Ovary
 Not visualized.
Impression

 Follow up growth due to elevated BMI.
 Normal interval growth with measurements large for with
 dates
 Good fetal movement and amniotic fluid volume

 Ms. Aikanga is doing well. She had a normal 1 hr GTT. The AC
 and the EFW is large for dates. I discussed today's findings
 with Ms. Aikanga and recommended repeat growth in 4 weeks.
Recommendations

 Follow up growth in 4 weeks.

## 2021-08-18 ENCOUNTER — Telehealth: Payer: Self-pay | Admitting: *Deleted

## 2021-08-18 NOTE — Telephone Encounter (Signed)
Returned TC to pt regarding "problems" with Nexplanon. Reports irregular bleeding and numbness when laying on arm with Nexplanon. Pt already has appt scheduled to discuss with provider. Advised pt to keep appt and seek care for any emergent concerns.

## 2021-09-20 ENCOUNTER — Encounter: Payer: Self-pay | Admitting: Advanced Practice Midwife

## 2021-09-20 ENCOUNTER — Ambulatory Visit: Payer: Medicaid Other | Admitting: Advanced Practice Midwife

## 2021-09-20 ENCOUNTER — Other Ambulatory Visit: Payer: Self-pay | Admitting: Emergency Medicine

## 2021-09-20 VITALS — BP 128/87 | HR 87 | Ht 64.0 in | Wt 241.4 lb

## 2021-09-20 DIAGNOSIS — N921 Excessive and frequent menstruation with irregular cycle: Secondary | ICD-10-CM

## 2021-09-20 DIAGNOSIS — Z3009 Encounter for other general counseling and advice on contraception: Secondary | ICD-10-CM

## 2021-09-20 DIAGNOSIS — Z975 Presence of (intrauterine) contraceptive device: Secondary | ICD-10-CM

## 2021-09-20 DIAGNOSIS — Z3041 Encounter for surveillance of contraceptive pills: Secondary | ICD-10-CM | POA: Insufficient documentation

## 2021-09-20 DIAGNOSIS — R87612 Low grade squamous intraepithelial lesion on cytologic smear of cervix (LGSIL): Secondary | ICD-10-CM | POA: Diagnosis not present

## 2021-09-20 LAB — POCT URINE PREGNANCY: Preg Test, Ur: NEGATIVE

## 2021-09-20 MED ORDER — NORETHIN ACE-ETH ESTRAD-FE 1-20 MG-MCG(24) PO TABS: 1.0000 | ORAL_TABLET | Freq: Every day | ORAL | 11 refills | Status: DC

## 2021-09-20 NOTE — Progress Notes (Signed)
Pt states she has had AUB with Nexplanon since insertion until 2 weeks ago and now her period is late. Pt took UPT at home x2 and state she noticed a "faint line". Pt states she has mood changes and headache since insertion and is considering switching to [facility] pills.  Nexplanon inserted 4/23.

## 2021-09-20 NOTE — Progress Notes (Signed)
   GYNECOLOGY PROGRESS NOTE  History:  21 y.o. G1P1001 presents to [facility] office today for problem gyn visit. She reports frequent bleeding with Nexplanon, bleeding every 2 weeks since Nexplanon was placed 06/15/20.  She also reports mood changes that started with Nexplanon placement.  She desires removal. She is wanting to plan a pregnancy but wants to wait another year.   She denies h/a, dizziness, shortness of breath, n/v, or fever/chills.    The following portions of the patient's history were reviewed and updated as appropriate: allergies, current medications, past family history, past medical history, past social history, past surgical history and problem list. Last pap smear on 10/28/19 was abnormal with LSIL.    Health Maintenance Due  Topic Date Due   COVID-19 Vaccine (1) Never done   HPV VACCINES (1 - 2-dose series) Never done   TETANUS/TDAP  Never done     Review of Systems:  Pertinent items are noted in HPI.   Objective:  Physical Exam Blood pressure 128/87, pulse 87, height 5\' 4"  (1.626 m), weight 241 lb 6.4 oz (109.5 kg), not currently breastfeeding. VS reviewed, nursing note reviewed,  Constitutional: well developed, well nourished, no distress HEENT: normocephalic CV: normal rate Pulm/chest wall: normal effort Breast Exam: deferred Abdomen: soft Neuro: alert and oriented x 3 Skin: warm, dry Psych: affect normal Pelvic exam: Deferred   Nexplanon Removal Patient identified, informed consent performed, consent signed.   Appropriate time out taken. Nexplanon site identified.  Area prepped in usual sterile fashon. One ml of 1% lidocaine was used to anesthetize the area at the distal end of the implant. A small stab incision was made right beside the implant on the distal portion.  The Nexplanon rod was grasped using hemostats and removed without difficulty.  There was minimal blood loss. There were no complications.  3 ml of 1% lidocaine was injected around the  incision for post-procedure analgesia.  Steri-strips were applied over the small incision.  A pressure bandage was applied to reduce any bruising.  The patient tolerated the procedure well and was given post procedure instructions.  Patient is planning to use OCPs for contraception.   Assessment & Plan:  1. Breakthrough bleeding on Nexplanon --Pt took 2 home pregnancy tests yesterday and had faint positive test.  UPT negative in the office today.  Will evaluate with hcg.  - POCT urine pregnancy - Beta hCG quant (ref lab)  2. LGSIL on Pap smear of cervix --Pap due in 2022 with LSIL in 2021 --Pt to reschedule for annual/Pap   3. Encounter for other general counseling or advice on contraception --Discussed pt contraceptive plans and reviewed contraceptive methods based on pt preferences and effectiveness.  Pt prefers to start OCPs. --Rx for Loestrin sent to pharmacy  --F/U in 3 months     2022, CNM 10:58 AM

## 2021-09-20 NOTE — Addendum Note (Signed)
Addended by: Sharen Counter A on: 09/20/2021 04:54 PM   Modules accepted: Orders

## 2021-09-21 LAB — BETA HCG QUANT (REF LAB): hCG Quant: <1 m[IU]/mL

## 2021-11-17 ENCOUNTER — Ambulatory Visit: Payer: Medicaid Other | Admitting: Obstetrics and Gynecology

## 2021-12-06 ENCOUNTER — Ambulatory Visit (INDEPENDENT_AMBULATORY_CARE_PROVIDER_SITE_OTHER): Payer: Medicaid Other | Admitting: General Practice

## 2021-12-06 DIAGNOSIS — Z3201 Encounter for pregnancy test, result positive: Secondary | ICD-10-CM | POA: Diagnosis not present

## 2021-12-06 DIAGNOSIS — Z3200 Encounter for pregnancy test, result unknown: Secondary | ICD-10-CM

## 2021-12-06 LAB — POCT URINE PREGNANCY: Preg Test, Ur: POSITIVE — AB

## 2021-12-06 NOTE — Progress Notes (Signed)
Sydney Kelley presents today for UPT. She has no unusual complaints. LMP: unknown   Pt had Beta quant 09-20-21 for possible pregnancy that was negative. Estimated LMP around 8/23.      OBJECTIVE: Appears well, in no apparent distress.  OB History     Gravida  1   Para  1   Term  1   Preterm      AB      Living  1      SAB      IAB      Ectopic      Multiple  0   Live Births  1          Home UPT Result: Positive x2  In-Office UPT result: Positive I have reviewed the patient's medical, obstetrical, social, and family histories, and medications.   ASSESSMENT: Positive pregnancy test  PLAN Prenatal care to be completed at: [facility]  Pt to return for dating [place]

## 2021-12-07 ENCOUNTER — Ambulatory Visit (INDEPENDENT_AMBULATORY_CARE_PROVIDER_SITE_OTHER): Payer: Medicaid Other

## 2021-12-07 VITALS — BP 135/80 | HR 91 | Ht 64.0 in | Wt 244.4 lb

## 2021-12-07 DIAGNOSIS — O3680X0 Pregnancy with inconclusive fetal viability, not applicable or unspecified: Secondary | ICD-10-CM | POA: Diagnosis not present

## 2021-12-07 DIAGNOSIS — Z3480 Encounter for supervision of other normal pregnancy, unspecified trimester: Secondary | ICD-10-CM | POA: Insufficient documentation

## 2021-12-07 DIAGNOSIS — Z3A08 8 weeks gestation of pregnancy: Secondary | ICD-10-CM

## 2021-12-07 DIAGNOSIS — Z3481 Encounter for supervision of other normal pregnancy, first trimester: Secondary | ICD-10-CM

## 2021-12-07 MED ORDER — GOJJI WEIGHT SCALE MISC: 1.0000 | 0 refills | Status: DC

## 2021-12-07 MED ORDER — VITAFOL GUMMIES 3.33-0.333-34.8 MG PO CHEW: 3.0000 | CHEWABLE_TABLET | Freq: Every day | ORAL | 11 refills | Status: DC

## 2021-12-07 NOTE — Progress Notes (Signed)
New OB Intake  I connected with  Sydney Kelley on 12/07/21 at  1:10 PM EDT by in person and verified that I am speaking with the correct person using two identifiers. Nurse is located at [facility] and pt is located at [facility].  I discussed the limitations, risks, security and privacy concerns of performing an evaluation and management service by telephone and the availability of in person appointments. I also discussed with the patient that there may be a patient responsible charge related to this service. The patient expressed understanding and agreed to proceed.  I explained I am completing New OB Intake today. We discussed her EDD of 07/19/22 that is based on early first trimester u/s. Pt is G2/P1001. I reviewed her allergies, medications, Medical/Surgical/OB history, and appropriate screenings. I informed her of [facility] services. [facility] information placed in AVS. Based on history, this is a/an  pregnancy uncomplicated .   Patient Active Problem List   Diagnosis Date Noted   Oral contraceptive use 09/20/2021   History of gestational hypertension 05/03/2020   LGSIL on Pap smear of cervix 11/01/2019    Concerns addressed today  Delivery Plans Plans to deliver at [facility]. Patient given information for [facility] Healthy Baby website for more information about Women's and [place]. Patient is not interested in water birth. Offered upcoming OB visit with CNM to discuss further.  MyChart/Babyscripts MyChart access verified. I explained pt will have some visits in office and some virtually. Babyscripts instructions given and order placed. Patient verifies receipt of registration text/e-mail. Account successfully created and app downloaded.  Blood Pressure Cuff/Weight Scale Patient has access to BP cuff at home from previous pregnancy. Explained after first prenatal appt pt will check weekly and document in 84. Patient does not  have weight scale. Weight scale ordered for patient  to pick up from [employer].   Anatomy US Explained first scheduled [place] will be around 19 weeks. Anatomy US to be scheduled at [facility] Provider visit. Date TBD.  Labs Discussed Johnsie Cancel genetic screening with patient. Would like both Panorama and Horizon drawn at new OB visit. Routine prenatal labs needed.  Covid Vaccine Patient has covid vaccine.   Is patient a CenteringPregnancy candidate?  Declined Declined due to Group Setting Not a candidate due to  Centering Patient" indicated on sticky note  Social Determinants of Health Food Insecurity: Patient denies food insecurity. WIC Referral: Patient is interested in referral to [facility].  Transportation: Patient denies transportation needs. Childcare: Discussed no children allowed at ultrasound appointments. Offered childcare services; patient declines childcare services at this time.  First visit review I reviewed new OB appt with pt. I explained she will have a provider visit that includes pap smear, genetic screening, and discuss plan of care for pregnancy. Explained pt will be seen by TBD at first visit; encounter routed to appropriate provider. Explained that patient will be seen by pregnancy navigator following visit with provider.   Lucianne Lei, RN 12/07/2021  1:10 PM

## 2021-12-08 LAB — CBC/D/PLT+RPR+RH+ABO+RUBIGG...
Antibody Screen: NEGATIVE
Basophils Absolute: 0 10*3/uL (ref 0.0–0.2)
Basos: 0 %
EOS (ABSOLUTE): 0.1 10*3/uL (ref 0.0–0.4)
Eos: 0 %
HCV Ab: NONREACTIVE
HIV Screen 4th Generation wRfx: NONREACTIVE
Hematocrit: 34.8 % (ref 34.0–46.6)
Hemoglobin: 11.3 g/dL (ref 11.1–15.9)
Hepatitis B Surface Ag: NEGATIVE
Immature Grans (Abs): 0 10*3/uL (ref 0.0–0.1)
Immature Granulocytes: 0 %
Lymphocytes Absolute: 1.6 10*3/uL (ref 0.7–3.1)
Lymphs: 13 %
MCH: 25.3 pg — ABNORMAL LOW (ref 26.6–33.0)
MCHC: 32.5 g/dL (ref 31.5–35.7)
MCV: 78 fL — ABNORMAL LOW (ref 79–97)
Monocytes Absolute: 0.5 10*3/uL (ref 0.1–0.9)
Monocytes: 4 %
Neutrophils Absolute: 9.6 10*3/uL — ABNORMAL HIGH (ref 1.4–7.0)
Neutrophils: 83 %
Platelets: 210 10*3/uL (ref 150–450)
RBC: 4.46 x10E6/uL (ref 3.77–5.28)
RDW: 15.1 % (ref 11.7–15.4)
RPR Ser Ql: NONREACTIVE
Rh Factor: POSITIVE
Rubella Antibodies, IGG: 2.34 index (ref >0.99)
WBC: 11.7 10*3/uL — ABNORMAL HIGH (ref 3.4–10.8)

## 2021-12-08 LAB — HCV INTERPRETATION

## 2021-12-08 LAB — HEMOGLOBIN A1C
Est. average glucose Bld gHb Est-mCnc: 114 mg/dL
Hgb A1c MFr Bld: 5.6 % (ref 4.8–5.6)

## 2021-12-09 LAB — URINE CULTURE, OB REFLEX

## 2021-12-09 LAB — CULTURE, OB URINE

## 2022-01-10 ENCOUNTER — Encounter: Payer: Self-pay | Admitting: Obstetrics and Gynecology

## 2022-01-10 ENCOUNTER — Ambulatory Visit (INDEPENDENT_AMBULATORY_CARE_PROVIDER_SITE_OTHER): Payer: Medicaid Other | Admitting: Obstetrics and Gynecology

## 2022-01-10 ENCOUNTER — Other Ambulatory Visit (HOSPITAL_COMMUNITY)
Admission: RE | Admit: 2022-01-10 | Discharge: 2022-01-10 | Disposition: A | Discharge status: Home / Self Care | Payer: Medicaid Other | Source: Ambulatory Visit | Attending: Obstetrics and Gynecology | Admitting: Obstetrics and Gynecology

## 2022-01-10 VITALS — BP 115/77 | HR 93 | Wt 246.0 lb

## 2022-01-10 DIAGNOSIS — Z3480 Encounter for supervision of other normal pregnancy, unspecified trimester: Secondary | ICD-10-CM | POA: Diagnosis present

## 2022-01-10 DIAGNOSIS — Z3A12 12 weeks gestation of pregnancy: Secondary | ICD-10-CM | POA: Diagnosis not present

## 2022-01-10 DIAGNOSIS — Z8759 Personal history of other complications of pregnancy, childbirth and the puerperium: Secondary | ICD-10-CM

## 2022-01-10 DIAGNOSIS — R87612 Low grade squamous intraepithelial lesion on cytologic smear of cervix (LGSIL): Secondary | ICD-10-CM | POA: Diagnosis present

## 2022-01-10 DIAGNOSIS — Z3481 Encounter for supervision of other normal pregnancy, first trimester: Secondary | ICD-10-CM

## 2022-01-10 MED ORDER — ASPIRIN 81 MG PO TBEC: 81.0000 mg | DELAYED_RELEASE_TABLET | Freq: Every day | ORAL | 2 refills | Status: DC

## 2022-01-10 NOTE — Progress Notes (Signed)
Pt presents for NOB visit. Pt c/o of lower abdominal pain and nausea. No other concerns

## 2022-01-10 NOTE — Patient Instructions (Signed)
First Trimester of Pregnancy  The first trimester of pregnancy starts on the first day of your last menstrual period until the end of week 12. This is months 1 through 3 of pregnancy. A week after a sperm fertilizes an egg, the egg will implant into the wall of the uterus and begin to develop into a baby. By the end of 12 weeks, all the baby's organs will be formed and the baby will be 2-3 inches in size. Body changes during your first trimester Your body goes through many changes during pregnancy. The changes vary and generally return to normal after your baby is born. Physical changes You may gain or lose weight. Your breasts may begin to grow larger and become tender. The tissue that surrounds your nipples (areola) may become darker. Dark spots or blotches (chloasma or mask of pregnancy) may develop on your face. You may have changes in your hair. These can include thickening or thinning of your hair or changes in texture. Health changes You may feel nauseous, and you may vomit. You may have heartburn. You may develop headaches. You may develop constipation. Your gums may bleed and may be sensitive to brushing and flossing. Other changes You may tire easily. You may urinate more often. Your menstrual periods will stop. You may have a loss of appetite. You may develop cravings for certain kinds of food. You may have changes in your emotions from day to day. You may have more vivid and strange dreams. Follow these instructions at home: Medicines Follow your health care provider's instructions regarding medicine use. Specific medicines may be either safe or unsafe to take during pregnancy. Do not take any medicines unless told to by your health care provider. Take a prenatal vitamin that contains at least 600 micrograms (mcg) of folic acid. Eating and drinking Eat a healthy diet that includes fresh fruits and vegetables, whole grains, good sources of protein such as meat, eggs, or tofu,  and low-fat dairy products. Avoid raw meat and unpasteurized juice, milk, and cheese. These carry germs that can harm you and your baby. If you feel nauseous or you vomit: Eat 4 or 5 small meals a day instead of 3 large meals. Try eating a few soda crackers. Drink liquids between meals instead of during meals. You may need to take these actions to prevent or treat constipation: Drink enough fluid to keep your urine pale yellow. Eat foods that are high in fiber, such as beans, whole grains, and fresh fruits and vegetables. Limit foods that are high in fat and processed sugars, such as fried or sweet foods. Activity Exercise only as directed by your health care provider. Most people can continue their usual exercise routine during pregnancy. Try to exercise for 30 minutes at least 5 days a week. Stop exercising if you develop pain or cramping in the lower abdomen or lower back. Avoid exercising if it is very hot or humid or if you are at high altitude. Avoid heavy lifting. If you choose to, you may have sex unless your health care provider tells you not to. Relieving pain and discomfort Wear a good support bra to relieve breast tenderness. Rest with your legs elevated if you have leg cramps or low back pain. If you develop bulging veins (varicose veins) in your legs: Wear support hose as told by your health care provider. Elevate your feet for 15 minutes, 3-4 times a day. Limit salt in your diet. Safety Wear your seat belt at all times when   driving or riding in a car. Talk with your health care provider if someone is verbally or physically abusive to you. Talk with your health care provider if you are feeling sad or have thoughts of hurting yourself. Lifestyle Do not use hot tubs, steam rooms, or saunas. Do not douche. Do not use tampons or scented sanitary pads. Do not use herbal remedies, alcohol, illegal drugs, or medicines that are not approved by your health care provider. Chemicals  in these products can harm your baby. Do not use any products that contain nicotine or tobacco, such as cigarettes, e-cigarettes, and chewing tobacco. If you need help quitting, ask your health care provider. Avoid cat litter boxes and soil used by cats. These carry germs that can cause birth defects in the baby and possibly loss of the unborn baby (fetus) by miscarriage or stillbirth. General instructions During routine prenatal visits in the first trimester, your health care provider will do a physical exam, perform necessary tests, and ask you how things are going. Keep all follow-up visits. This is important. Ask for help if you have counseling or nutritional needs during pregnancy. Your health care provider can offer advice or refer you to specialists for help with various needs. Schedule a dentist appointment. At home, brush your teeth with a soft toothbrush. Floss gently. Write down your questions. Take them to your prenatal visits. Where to find more information American Pregnancy Association: americanpregnancy.org American College of Obstetricians and Gynecologists: acog.org/en/Womens%20Health/Pregnancy Office on Women's Health: womenshealth.gov/pregnancy Contact a health care provider if you have: Dizziness. A fever. Mild pelvic cramps, pelvic pressure, or nagging pain in the abdominal area. Nausea, vomiting, or diarrhea that lasts for 24 hours or longer. A bad-smelling vaginal discharge. Pain when you urinate. Known exposure to a contagious illness, such as chickenpox, measles, Zika virus, HIV, or hepatitis. Get help right away if you have: Spotting or bleeding from your vagina. Severe abdominal cramping or pain. Shortness of breath or chest pain. Any kind of trauma, such as from a fall or a car crash. New or increased pain, swelling, or redness in an arm or leg. Summary The first trimester of pregnancy starts on the first day of your last menstrual period until the end of week  12 (months 1 through 3). Eating 4 or 5 small meals a day rather than 3 large meals may help to relieve nausea and vomiting. Do not use any products that contain nicotine or tobacco, such as cigarettes, e-cigarettes, and chewing tobacco. If you need help quitting, ask your health care provider. Keep all follow-up visits. This is important. This information is not intended to replace advice given to you by your health care provider. Make sure you discuss any questions you have with your health care provider. Document Revised: 07/30/2019 Document Reviewed: 06/05/2019 Elsevier Patient Education  2023 [employer].  

## 2022-01-10 NOTE — Progress Notes (Signed)
Subjective:  Sydney Kelley is a 21 y.o. G2P1001 at 24w6dbeing seen today for her first OB visit. EBB by first trimester U/S. H/O TSVD x1 withoutproblems. H/O GHTN. ongoing prenatal care.  She is currently monitored for the following issues for this low-risk pregnancy and has LGSIL on Pap smear of cervix; History of gestational hypertension; and Supervision of other normal pregnancy, antepartum on their problem list.  Patient reports no complaints.  Contractions: Not present. Vag. Bleeding: None.  Movement: Absent. Denies leaking of fluid.   The following portions of the patient's history were reviewed and updated as appropriate: allergies, current medications, past family history, past medical history, past social history, past surgical history and problem list. Problem list updated.  Objective:   Vitals:   01/10/22 0826  Weight: 246 lb (111.6 kg)    Fetal Status: Fetal Heart Rate (bpm): 154   Movement: Absent     General:  Alert, oriented and cooperative. Patient is in no acute distress.  Skin: Skin is warm and dry. No rash noted.   Cardiovascular: Normal heart rate noted  Respiratory: Normal respiratory effort, no problems with respiration noted  Abdomen: Soft, gravid, appropriate for gestational age. Pain/Pressure: Present     Pelvic:  Cervical exam performed        Extremities: Normal range of motion.  Edema: None  Mental Status: Normal mood and affect. Normal behavior. Normal judgment and thought content.   Urinalysis:      Assessment and Plan:  Pregnancy: G2P1001 at 149w6d1. Supervision of other normal pregnancy, antepartum Prenatal care and labs discussed with pt Genetic testing reviewed - [place]FM OB DETAIL +14 WK; Future - Cervicovaginal ancillary only( [place]) - Cytology - PAP( [place]) - Protein / creatinine ratio, urine - Comp Met (CMET)  2. History of gestational hypertension Will start qd BASA - Protein / creatinine ratio, urine - Comp Met  (CMET)  3. LGSIL on Pap smear of cervix Repeat pap today - Cytology - PAP( [place])  Preterm labor symptoms and general obstetric precautions including but not limited to vaginal bleeding, contractions, leaking of fluid and fetal movement were reviewed in detail with the patient. Please refer to After Visit Summary for other counseling recommendations.  Return in about 4 weeks (around 02/07/2022) for OB visit, face to face, any provider.   ErChancy MilroyMD

## 2022-01-11 ENCOUNTER — Ambulatory Visit: Payer: Medicaid Other | Admitting: Student

## 2022-01-11 LAB — COMPREHENSIVE METABOLIC PANEL
ALT: 9 IU/L (ref 0–32)
AST: 12 IU/L (ref 0–40)
Albumin/Globulin Ratio: 1.5 (ref 1.2–2.2)
Albumin: 3.9 g/dL — ABNORMAL LOW (ref 4.0–5.0)
Alkaline Phosphatase: 117 IU/L (ref 44–121)
BUN/Creatinine Ratio: 10 (ref 9–23)
BUN: 5 mg/dL — ABNORMAL LOW (ref 6–20)
Bilirubin Total: <0.2 mg/dL (ref 0.0–1.2)
CO2: 19 mmol/L — ABNORMAL LOW (ref 20–29)
Calcium: 9.2 mg/dL (ref 8.7–10.2)
Chloride: 105 mmol/L (ref 96–106)
Creatinine, Ser: 0.52 mg/dL — ABNORMAL LOW (ref 0.57–1.00)
Globulin, Total: 2.6 g/dL (ref 1.5–4.5)
Glucose: 92 mg/dL (ref 70–99)
Potassium: 4 mmol/L (ref 3.5–5.2)
Sodium: 136 mmol/L (ref 134–144)
Total Protein: 6.5 g/dL (ref 6.0–8.5)
eGFR: 134 mL/min/{1.73_m2} (ref >59)

## 2022-01-11 LAB — CERVICOVAGINAL ANCILLARY ONLY
Bacterial Vaginitis (gardnerella): NEGATIVE
Candida Glabrata: NEGATIVE
Candida Vaginitis: POSITIVE — AB
Chlamydia: NEGATIVE
Comment: NEGATIVE
Comment: NEGATIVE
Comment: NEGATIVE
Comment: NEGATIVE
Comment: NEGATIVE
Comment: NORMAL
Neisseria Gonorrhea: NEGATIVE
Trichomonas: NEGATIVE

## 2022-01-11 LAB — PROTEIN / CREATININE RATIO, URINE
Creatinine, Urine: 177.5 mg/dL
Protein, Ur: 13.2 mg/dL
Protein/Creat Ratio: 74 mg/g creat (ref 0–200)

## 2022-01-12 ENCOUNTER — Encounter: Payer: Self-pay | Admitting: Obstetrics and Gynecology

## 2022-01-12 ENCOUNTER — Telehealth: Payer: Self-pay | Admitting: Emergency Medicine

## 2022-01-12 ENCOUNTER — Other Ambulatory Visit: Payer: Self-pay | Admitting: Emergency Medicine

## 2022-01-12 LAB — CYTOLOGY - PAP
Comment: NEGATIVE
Diagnosis: NEGATIVE
High risk HPV: NEGATIVE

## 2022-01-12 MED ORDER — PROMETHAZINE HCL 25 MG PO TABS: 25.0000 mg | ORAL_TABLET | Freq: Four times a day (QID) | ORAL | 0 refills | Status: DC | PRN

## 2022-01-12 MED ORDER — TERCONAZOLE 0.8 % VA CREA: 1.0000 | TOPICAL_CREAM | Freq: Every day | VAGINAL | 0 refills | Status: DC

## 2022-01-12 NOTE — Progress Notes (Signed)
Rx for nausea per pt request

## 2022-01-12 NOTE — Telephone Encounter (Signed)
Attempted TC to patient to discuss results. LVM. Rx sent to pharmacy.

## 2022-01-15 LAB — PANORAMA PRENATAL TEST FULL PANEL:PANORAMA TEST PLUS 5 ADDITIONAL MICRODELETIONS: FETAL FRACTION: 4.1

## 2022-02-07 ENCOUNTER — Ambulatory Visit (INDEPENDENT_AMBULATORY_CARE_PROVIDER_SITE_OTHER): Payer: Medicaid Other | Admitting: Obstetrics and Gynecology

## 2022-02-07 VITALS — BP 116/74 | HR 91 | Wt 248.5 lb

## 2022-02-07 DIAGNOSIS — Z3A16 16 weeks gestation of pregnancy: Secondary | ICD-10-CM

## 2022-02-07 DIAGNOSIS — Z6841 Body Mass Index (BMI) 40.0 and over, adult: Secondary | ICD-10-CM | POA: Insufficient documentation

## 2022-02-07 DIAGNOSIS — Z3480 Encounter for supervision of other normal pregnancy, unspecified trimester: Secondary | ICD-10-CM

## 2022-02-07 DIAGNOSIS — Z3482 Encounter for supervision of other normal pregnancy, second trimester: Secondary | ICD-10-CM

## 2022-02-07 DIAGNOSIS — Z8759 Personal history of other complications of pregnancy, childbirth and the puerperium: Secondary | ICD-10-CM

## 2022-02-07 NOTE — Progress Notes (Signed)
ROB in office, reports fetal flutter movement with occasional pressure.

## 2022-02-07 NOTE — Progress Notes (Signed)
   PRENATAL VISIT NOTE  Subjective:  Sydney Kelley is a 21 y.o. G2P1001 at [redacted]w[redacted]d being seen today for ongoing prenatal care.  She is currently monitored for the following issues for this high-risk pregnancy and has LGSIL on Pap smear of cervix; History of gestational hypertension; Supervision of other normal pregnancy, antepartum; and BMI 40.0-44.9, adult (HCC) on their problem list.  Patient doing well with no acute concerns today. She reports no complaints.  Contractions: Not present. Vag. Bleeding: None.  Movement: Present. Denies leaking of fluid.   The following portions of the patient's history were reviewed and updated as appropriate: allergies, current medications, past family history, past medical history, past social history, past surgical history and problem list. Problem list updated.  Objective:   Vitals:   02/07/22 1607  BP: 116/74  Pulse: 91  Weight: 248 lb 8 oz (112.7 kg)    Fetal Status: Fetal Heart Rate (bpm): 140   Movement: Present     General:  Alert, oriented and cooperative. Patient is in no acute distress.  Skin: Skin is warm and dry. No rash noted.   Cardiovascular: Normal heart rate noted  Respiratory: Normal respiratory effort, no problems with respiration noted  Abdomen: Soft, gravid, appropriate for gestational age.  Pain/Pressure: Present     Pelvic: Cervical exam deferred        Extremities: Normal range of motion.  Edema: None  Mental Status:  Normal mood and affect. Normal behavior. Normal judgment and thought content.   Assessment and Plan:  Pregnancy: G2P1001 at [redacted]w[redacted]d  1. Supervision of other normal pregnancy, antepartum Continue routine prenatal care  - AFP, Serum, Open Spina Bifida  2. History of gestational hypertension BP WNL, pt notes compliance with baby ASA Anatomy /growth on 02/23/22  3. BMI 40.0-44.9, adult (HCC)   Preterm labor symptoms and general obstetric precautions including but not limited to vaginal bleeding,  contractions, leaking of fluid and fetal movement were reviewed in detail with the patient.  Please refer to After Visit Summary for other counseling recommendations.   Return in about 4 weeks (around 03/07/2022) for ROB, in person.   Mariel Aloe, MD Faculty Attending Center for [facility]

## 2022-02-09 LAB — AFP, SERUM, OPEN SPINA BIFIDA
AFP MoM: 1.03
AFP Value: 28 ng/mL
Gest. Age on Collection Date: 16.6 weeks
Maternal Age At EDD: 23.9 yr
OSBR Risk 1 IN: 10000
Test Results:: NEGATIVE
Weight: 248 [lb_av]

## 2022-02-22 ENCOUNTER — Ambulatory Visit: Payer: Medicaid Other

## 2022-02-22 ENCOUNTER — Other Ambulatory Visit: Payer: Medicaid Other

## 2022-02-23 ENCOUNTER — Ambulatory Visit: Payer: Medicaid Other

## 2022-03-06 NOTE — L&D Delivery Note (Signed)
Delivery Note Essance Mcspadden Sophy Berndt is a 22 y.o. [id number] at [redacted]w[redacted]d admitted for IOL for Obesity, BMI >40.   GBS Status:  Negative/-- (04/16 1426)  Labor course: Initial SVE: 1.5/20/-2. Augmentation with: Pitocin, Cytotec, and IP Foley. She then progressed to complete.  ROM: 7h 43m with clear fluid  Birth: Delivery of a Live born female  Birth Weight: 8 lb (3630 g) APGAR: 8, 9  Newborn Delivery   Birth date/time: 07/14/20[birth year] 07:06:23 Delivery type: Vaginal, Spontaneous         Delivered via spontaneous vaginal delivery (Presentation: OA ). Nuchal cord present: No.  Shoulders and body delivered in usual fashion. Infant placed directly on mom's abdomen for bonding/skin-to-skin, baby dried and stimulated. Cord clamped x 2 after 1 minute and cut by FOB.  Cord blood collected. Placenta delivered-Spontaneous  with 3 vessels . 20u Pitocin in 500cc LR given as a bolus prior to delivery of placenta.  Fundus firm with massage. Placenta inspected and appears to be intact with a 3 VC.  Sponge and instrument count were correct x2.  Intrapartum complications:  None Anesthesia:  epidural Lacerations:  none Suture Repair:  EBL (mL):308.00     Mom to postpartum.  Baby to Couplet care / Skin to Skin. Placenta to L&D   Plans to Breast and bottlefeed Contraception: tubal ligation Circumcision: wants inpatient  Note sent to [facility]: [facility] for pp visit.  Delivery Report:  Review the Delivery Report for details.     Signed: Jacklyn Shell, DNP,CNM 07/17/2022, 7:11 PM

## 2022-03-07 ENCOUNTER — Ambulatory Visit (INDEPENDENT_AMBULATORY_CARE_PROVIDER_SITE_OTHER): Payer: Medicaid Other | Admitting: Obstetrics & Gynecology

## 2022-03-07 VITALS — BP 112/69 | HR 92 | Wt 253.0 lb

## 2022-03-07 DIAGNOSIS — Z3A20 20 weeks gestation of pregnancy: Secondary | ICD-10-CM

## 2022-03-07 DIAGNOSIS — Z3482 Encounter for supervision of other normal pregnancy, second trimester: Secondary | ICD-10-CM

## 2022-03-07 DIAGNOSIS — R87612 Low grade squamous intraepithelial lesion on cytologic smear of cervix (LGSIL): Secondary | ICD-10-CM

## 2022-03-07 DIAGNOSIS — Z6841 Body Mass Index (BMI) 40.0 and over, adult: Secondary | ICD-10-CM

## 2022-03-07 DIAGNOSIS — Z3480 Encounter for supervision of other normal pregnancy, unspecified trimester: Secondary | ICD-10-CM

## 2022-03-07 NOTE — Progress Notes (Signed)
   PRENATAL VISIT NOTE  Subjective:  Sydney Kelley is a 22 y.o. G2P1001 at [redacted]w[redacted]d being seen today for ongoing prenatal care.  She is currently monitored for the following issues for this high-risk pregnancy and has LGSIL on Pap smear of cervix; History of gestational hypertension; Supervision of other normal pregnancy, antepartum; and BMI 40.0-44.9, adult ([place]) on their problem list.  Patient reports backache and right sciatic pain .  Contractions: Not present. Vag. Bleeding: None.  Movement: Present. Denies leaking of fluid.   The following portions of the patient's history were reviewed and updated as appropriate: allergies, current medications, past family history, past medical history, past social history, past surgical history and problem list.   Objective:   Vitals:   03/07/22 1551  BP: 112/69  Pulse: 92  Weight: 253 lb (114.8 kg)    Fetal Status: Fetal Heart Rate (bpm): 147   Movement: Present     General:  Alert, oriented and cooperative. Patient is in no acute distress.  Skin: Skin is warm and dry. No rash noted.   Cardiovascular: Normal heart rate noted  Respiratory: Normal respiratory effort, no problems with respiration noted  Abdomen: Soft, gravid, appropriate for gestational age.  Pain/Pressure: Present     Pelvic: Cervical exam deferred        Extremities: Normal range of motion.  Edema: None  Mental Status: Normal mood and affect. Normal behavior. Normal judgment and thought content.   Assessment and Plan:  Pregnancy: G2P1001 at [redacted]w[redacted]d 1. Supervision of other normal pregnancy, antepartum Sciatic or piriformis pain discussed and information given  2. LGSIL on Pap smear of cervix Normal f/u 11/23, repeat pap 11/24  Preterm labor symptoms and general obstetric precautions including but not limited to vaginal bleeding, contractions, leaking of fluid and fetal movement were reviewed in detail with the patient. Please refer to After Visit Summary for other  counseling recommendations.   Return in about 4 weeks (around 04/04/2022).  Future Appointments  Date Time Provider [place]  03/20/2022 12:30 PM [facility] NURSE [facility]  03/20/2022 12:45 PM [facility] US6 [facility] [place]    Emeterio Reeve, MD

## 2022-03-20 ENCOUNTER — Encounter: Payer: Self-pay | Admitting: *Deleted

## 2022-03-20 ENCOUNTER — Ambulatory Visit: Payer: Medicaid Other | Admitting: *Deleted

## 2022-03-20 ENCOUNTER — Ambulatory Visit: Payer: Medicaid Other | Attending: Maternal & Fetal Medicine

## 2022-03-20 ENCOUNTER — Other Ambulatory Visit: Payer: Self-pay | Admitting: *Deleted

## 2022-03-20 VITALS — BP 111/63 | HR 100

## 2022-03-20 DIAGNOSIS — Z3480 Encounter for supervision of other normal pregnancy, unspecified trimester: Secondary | ICD-10-CM | POA: Insufficient documentation

## 2022-03-20 DIAGNOSIS — Z3A22 22 weeks gestation of pregnancy: Secondary | ICD-10-CM | POA: Insufficient documentation

## 2022-03-20 DIAGNOSIS — O1492 Unspecified pre-eclampsia, second trimester: Secondary | ICD-10-CM | POA: Diagnosis not present

## 2022-03-20 DIAGNOSIS — O99212 Obesity complicating pregnancy, second trimester: Secondary | ICD-10-CM | POA: Insufficient documentation

## 2022-03-20 DIAGNOSIS — Z363 Encounter for antenatal screening for malformations: Secondary | ICD-10-CM | POA: Insufficient documentation

## 2022-03-20 DIAGNOSIS — O09292 Supervision of pregnancy with other poor reproductive or obstetric history, second trimester: Secondary | ICD-10-CM | POA: Diagnosis not present

## 2022-03-20 DIAGNOSIS — O99213 Obesity complicating pregnancy, third trimester: Secondary | ICD-10-CM

## 2022-04-04 ENCOUNTER — Ambulatory Visit (INDEPENDENT_AMBULATORY_CARE_PROVIDER_SITE_OTHER): Payer: Medicaid Other | Admitting: Obstetrics and Gynecology

## 2022-04-04 VITALS — BP 119/77 | HR 111 | Wt 261.0 lb

## 2022-04-04 DIAGNOSIS — Z8759 Personal history of other complications of pregnancy, childbirth and the puerperium: Secondary | ICD-10-CM

## 2022-04-04 DIAGNOSIS — Z3482 Encounter for supervision of other normal pregnancy, second trimester: Secondary | ICD-10-CM

## 2022-04-04 DIAGNOSIS — Z6841 Body Mass Index (BMI) 40.0 and over, adult: Secondary | ICD-10-CM

## 2022-04-04 DIAGNOSIS — Z3A24 24 weeks gestation of pregnancy: Secondary | ICD-10-CM

## 2022-04-04 DIAGNOSIS — Z3480 Encounter for supervision of other normal pregnancy, unspecified trimester: Secondary | ICD-10-CM

## 2022-04-04 NOTE — Progress Notes (Signed)
   PRENATAL VISIT NOTE  Subjective:  Sydney Kelley is a 22 y.o. G2P1001 at [redacted]w[redacted]d being seen today for ongoing prenatal care.  She is currently monitored for the following issues for this low-risk pregnancy and has LGSIL on Pap smear of cervix; History of gestational hypertension; Supervision of other normal pregnancy, antepartum; and BMI 40.0-44.9, adult ([place]) on their problem list.  Patient doing well with no acute concerns today. She reports no complaints.  Contractions: Not present. Vag. Bleeding: None.  Movement: Present. Denies leaking of fluid.   The following portions of the patient's history were reviewed and updated as appropriate: allergies, current medications, past family history, past medical history, past social history, past surgical history and problem list. Problem list updated.  Objective:   Vitals:   04/04/22 1553  BP: 119/77  Pulse: (!) 111  Weight: 261 lb (118.4 kg)    Fetal Status: Fetal Heart Rate (bpm): 137 Fundal Height: 24 cm Movement: Present     General:  Alert, oriented and cooperative. Patient is in no acute distress.  Skin: Skin is warm and dry. No rash noted.   Cardiovascular: Normal heart rate noted  Respiratory: Normal respiratory effort, no problems with respiration noted  Abdomen: Soft, gravid, appropriate for gestational age.  Pain/Pressure: Present     Pelvic: Cervical exam deferred        Extremities: Normal range of motion.  Edema: None  Mental Status:  Normal mood and affect. Normal behavior. Normal judgment and thought content.   Assessment and Plan:  Pregnancy: G2P1001 at [redacted]w[redacted]d  1. [redacted] weeks gestation of pregnancy   2. Supervision of other normal pregnancy, antepartum Continue routine prenatal care  3. BMI 40.0-44.9, adult ([place])   4. History of gestational hypertension BP WNL today  Preterm labor symptoms and general obstetric precautions including but not limited to vaginal bleeding, contractions, leaking of fluid and  fetal movement were reviewed in detail with the patient.  Please refer to After Visit Summary for other counseling recommendations.   Return in about 3 weeks (around 04/25/2022) for ROB, in person, 2 hr GTT, 3rd trim labs.   Lynnda Shields, MD Faculty Attending Center for [facility]

## 2022-04-04 NOTE — Progress Notes (Signed)
ROB, c/o pelvic pressure.

## 2022-04-17 ENCOUNTER — Ambulatory Visit: Payer: Medicaid Other | Admitting: *Deleted

## 2022-04-17 ENCOUNTER — Ambulatory Visit: Payer: Medicaid Other | Attending: Obstetrics and Gynecology

## 2022-04-17 ENCOUNTER — Other Ambulatory Visit: Payer: Self-pay | Admitting: *Deleted

## 2022-04-17 VITALS — BP 125/64 | HR 103

## 2022-04-17 DIAGNOSIS — Z3A26 26 weeks gestation of pregnancy: Secondary | ICD-10-CM | POA: Diagnosis not present

## 2022-04-17 DIAGNOSIS — O99213 Obesity complicating pregnancy, third trimester: Secondary | ICD-10-CM | POA: Diagnosis present

## 2022-04-17 DIAGNOSIS — Z362 Encounter for other antenatal screening follow-up: Secondary | ICD-10-CM | POA: Insufficient documentation

## 2022-04-17 DIAGNOSIS — O09292 Supervision of pregnancy with other poor reproductive or obstetric history, second trimester: Secondary | ICD-10-CM

## 2022-04-17 DIAGNOSIS — O99212 Obesity complicating pregnancy, second trimester: Secondary | ICD-10-CM

## 2022-04-17 DIAGNOSIS — Z3689 Encounter for other specified antenatal screening: Secondary | ICD-10-CM

## 2022-04-17 DIAGNOSIS — E669 Obesity, unspecified: Secondary | ICD-10-CM | POA: Diagnosis not present

## 2022-04-17 DIAGNOSIS — O09293 Supervision of pregnancy with other poor reproductive or obstetric history, third trimester: Secondary | ICD-10-CM

## 2022-05-02 ENCOUNTER — Ambulatory Visit (INDEPENDENT_AMBULATORY_CARE_PROVIDER_SITE_OTHER): Payer: Medicaid Other | Admitting: Obstetrics & Gynecology

## 2022-05-02 ENCOUNTER — Other Ambulatory Visit: Payer: Medicaid Other

## 2022-05-02 VITALS — BP 114/76 | HR 90 | Wt 263.0 lb

## 2022-05-02 DIAGNOSIS — Z3483 Encounter for supervision of other normal pregnancy, third trimester: Secondary | ICD-10-CM | POA: Diagnosis not present

## 2022-05-02 DIAGNOSIS — Z6841 Body Mass Index (BMI) 40.0 and over, adult: Secondary | ICD-10-CM

## 2022-05-02 DIAGNOSIS — Z3480 Encounter for supervision of other normal pregnancy, unspecified trimester: Secondary | ICD-10-CM

## 2022-05-02 DIAGNOSIS — Z8759 Personal history of other complications of pregnancy, childbirth and the puerperium: Secondary | ICD-10-CM

## 2022-05-02 DIAGNOSIS — Z3A28 28 weeks gestation of pregnancy: Secondary | ICD-10-CM | POA: Diagnosis not present

## 2022-05-02 NOTE — Progress Notes (Signed)
   PRENATAL VISIT NOTE  Subjective:  Sydney Kelley Sydney Kelley is a 22 y.o. G2P1001 at 76w6dbeing seen today for ongoing prenatal care.  She is currently monitored for the following issues for this high-risk pregnancy and has LGSIL on Pap smear of cervix; History of gestational hypertension; Supervision of other normal pregnancy, antepartum; and BMI 40.0-44.9, adult [place] problem [place].  Patient reports no complaints.  Contractions: Not present. Vag. Bleeding: None.  Movement: Present. Denies leaking of fluid.   The following portions of the patient's history were reviewed and updated as appropriate: allergies, current medications, past family history, past medical history, past social history, past surgical history and problem list.   Objective:   Vitals:   05/02/22 0853  BP: 114/76  Pulse: 90  Weight: 119.3 kg    Fetal Status: Fetal Heart Rate (bpm): 135   Movement: Present     General:  Alert, oriented and cooperative. Patient is in no acute distress.  Skin: Skin is warm and dry. No rash noted.   Cardiovascular: Normal heart rate noted  Respiratory: Normal respiratory effort, no problems with respiration noted  Abdomen: Soft, gravid, appropriate for gestational age.  Pain/Pressure: Present     Pelvic: Cervical exam deferred        Extremities: Normal range of motion.     Mental Status: Normal mood and affect. Normal behavior. Normal judgment and thought content.   Assessment and Plan:  Pregnancy: G2P1001 at 241w6d. History of gestational hypertension Nl BP  2. Supervision of other normal pregnancy, antepartum Routine 28 week labs - Glucose Tolerance, 2 Hours w/1 Hour - RPR - CBC - HIV antibody (with reflex)  3. BMI 40.0-44.9, adult (HCC) Body mass index is 45.14 kg/m.   Preterm labor symptoms and general obstetric precautions including but not limited to vaginal bleeding, contractions, leaking of fluid and fetal movement were reviewed in detail with the  patient. Please refer to After Visit Summary for other counseling recommendations.   Return in about 3 weeks (around 05/23/2022).  Future Appointments  Date Time Provider [place]3/13/2024 12:30 PM [facility]/13/2024 12:45 PM [facility] US4 [facility] [place]  JaEmeterio ReeveMD

## 2022-05-03 LAB — CBC
Hematocrit: 31 % — ABNORMAL LOW (ref 34.0–46.6)
Hemoglobin: 9.6 g/dL — ABNORMAL LOW (ref 11.1–15.9)
MCH: 23.7 pg — ABNORMAL LOW (ref 26.6–33.0)
MCHC: 31 g/dL — ABNORMAL LOW (ref 31.5–35.7)
MCV: 77 fL — ABNORMAL LOW (ref 79–97)
Platelets: 254 10*3/uL (ref 150–450)
RBC: 4.05 x10E6/uL (ref 3.77–5.28)
RDW: 14.2 % (ref 11.7–15.4)
WBC: 9.4 10*3/uL (ref 3.4–10.8)

## 2022-05-03 LAB — GLUCOSE TOLERANCE, 2 HOURS W/ 1HR
Glucose, 1 hour: 147 mg/dL (ref 70–179)
Glucose, 2 hour: 111 mg/dL (ref 70–152)
Glucose, Fasting: 89 mg/dL (ref 70–91)

## 2022-05-03 LAB — RPR: RPR Ser Ql: NONREACTIVE

## 2022-05-03 LAB — HIV ANTIBODY (ROUTINE TESTING W REFLEX): HIV Screen 4th Generation wRfx: NONREACTIVE

## 2022-05-17 ENCOUNTER — Ambulatory Visit: Payer: Medicaid Other | Admitting: *Deleted

## 2022-05-17 ENCOUNTER — Ambulatory Visit: Payer: Medicaid Other | Attending: Obstetrics

## 2022-05-17 ENCOUNTER — Other Ambulatory Visit: Payer: Self-pay | Admitting: *Deleted

## 2022-05-17 VITALS — BP 128/73 | HR 118

## 2022-05-17 DIAGNOSIS — E669 Obesity, unspecified: Secondary | ICD-10-CM

## 2022-05-17 DIAGNOSIS — Z3A31 31 weeks gestation of pregnancy: Secondary | ICD-10-CM | POA: Insufficient documentation

## 2022-05-17 DIAGNOSIS — O09293 Supervision of pregnancy with other poor reproductive or obstetric history, third trimester: Secondary | ICD-10-CM | POA: Diagnosis not present

## 2022-05-17 DIAGNOSIS — O1493 Unspecified pre-eclampsia, third trimester: Secondary | ICD-10-CM | POA: Insufficient documentation

## 2022-05-17 DIAGNOSIS — Z3480 Encounter for supervision of other normal pregnancy, unspecified trimester: Secondary | ICD-10-CM | POA: Insufficient documentation

## 2022-05-17 DIAGNOSIS — Z362 Encounter for other antenatal screening follow-up: Secondary | ICD-10-CM | POA: Insufficient documentation

## 2022-05-17 DIAGNOSIS — Z3689 Encounter for other specified antenatal screening: Secondary | ICD-10-CM | POA: Diagnosis present

## 2022-05-17 DIAGNOSIS — O99213 Obesity complicating pregnancy, third trimester: Secondary | ICD-10-CM | POA: Diagnosis present

## 2022-05-23 ENCOUNTER — Ambulatory Visit (INDEPENDENT_AMBULATORY_CARE_PROVIDER_SITE_OTHER): Payer: Medicaid Other

## 2022-05-23 VITALS — BP 126/74 | HR 108 | Wt 267.0 lb

## 2022-05-23 DIAGNOSIS — Z8759 Personal history of other complications of pregnancy, childbirth and the puerperium: Secondary | ICD-10-CM

## 2022-05-23 DIAGNOSIS — Z3480 Encounter for supervision of other normal pregnancy, unspecified trimester: Secondary | ICD-10-CM

## 2022-05-23 DIAGNOSIS — Z6841 Body Mass Index (BMI) 40.0 and over, adult: Secondary | ICD-10-CM

## 2022-05-23 DIAGNOSIS — Z3483 Encounter for supervision of other normal pregnancy, third trimester: Secondary | ICD-10-CM

## 2022-05-23 DIAGNOSIS — Z3A31 31 weeks gestation of pregnancy: Secondary | ICD-10-CM

## 2022-05-23 NOTE — Patient Instructions (Signed)
   PREGNANCY SUPPORT BELT: You are not alone, Seventy-five percent of women have some sort of abdominal or back pain at some point in their pregnancy. Your baby is growing at a fast pace, which means that your whole body is rapidly trying to adjust to the changes. As your uterus grows, your back may start feeling a bit under stress and this can result in back or abdominal pain that can go from mild, and therefore bearable, to severe pains that will not allow you to sit or lay down comfortably, When it comes to dealing with pregnancy-related pains and cramps, some pregnant women usually prefer natural remedies, which the market is filled with nowadays. For example, wearing a pregnancy support belt can help ease and lessen your discomfort and pain. WHAT ARE THE BENEFITS OF WEARING A PREGNANCY SUPPORT BELT? A pregnancy support belt provides support to the lower portion of the belly taking some of the weight of the growing uterus and distributing to the other parts of your body. It is designed make you comfortable and gives you extra support. Over the years, the pregnancy apparel market has been studying the needs and wants of pregnant women and they have come up with the most comfortable pregnancy support belts that woman could ever ask for. In fact, you will no longer have to wear a stretched-out or bulky pregnancy belt that is visible underneath your clothes and makes you feel even more uncomfortable. Nowadays, a pregnancy support belt is made of comfortable and stretchy materials that will not irritate your skin but will actually make you feel at ease and you will not even notice you are wearing it. They are easy to put on and adjust during the day and can be worn at night for additional support.  BENEFITS: Relives Back pain Relieves Abdominal Muscle and Leg Pain Stabilizes the Pelvic Ring Offers a Cushioned Abdominal Lift Pad Relieves pressure on the Sciatic Nerve Within Minutes    Locations that Carry  Maternity/Pregnancy Support Belts  You can find belts on [employer].com starting at $10-$15.  2.  The [facility] carries belts for $10-$15.        Address/Phone: [address], [address], [place], [place] [address], [phone]  3.  You may be able to file your insurance with [email or web address] and have a belt mailed to you.  If you have any problems getting the belt, let your [facility] office know.  

## 2022-05-23 NOTE — Progress Notes (Signed)
   LOW-RISK PREGNANCY OFFICE VISIT  Patient name: Sydney Kelley MRN [id number]  Date of birth: 2000-06-18 Chief Complaint:   Routine Prenatal Visit  Subjective:   Sydney Kelley is a 22 y.o. G2P1001 female at [redacted]w[redacted]d with an Estimated Date of Delivery: 07/19/22 being seen today for ongoing management of a low-risk pregnancy aeb has LGSIL on Pap smear of cervix; History of gestational hypertension; Supervision of other normal pregnancy, antepartum; and BMI 40.0-44.9, adult ([place]) on their problem list.  Patient presents today, alone, with  pelvic pain .  Patient endorses fetal movement. Patient reports abdominal cramping, but denies contractions.  Patient denies vaginal concerns including abnormal discharge, leaking of fluid, and bleeding. No issues with urination, constipation, or diarrhea. However, she does report some occasional gas pain.    Contractions: Not present. Vag. Bleeding: None.  Movement: Present.  Reviewed past medical,surgical, social, obstetrical and family history as well as problem list, medications and allergies.  Objective   Vitals:   05/23/22 1354  BP: 126/74  Pulse: (!) 108  Weight: 267 lb (121.1 kg)  Body mass index is 45.83 kg/m.  Total Weight Gain:22 lb (9.979 kg)         Physical Examination:   General appearance: Well appearing, and in no distress  Mental status: Alert, oriented to person, place, and time  Skin: Warm & dry  Cardiovascular: Normal heart rate noted  Respiratory: Normal respiratory effort, no distress  Abdomen: Soft, gravid, nontender, AGA with Fundal Height: 35 cm  Pelvic: Cervical exam deferred           Extremities: Edema: Trace  Fetal Status: Fetal Heart Rate (bpm): 152  Movement: Present   No results found for this or any previous visit (from the past 24 hour(s)).  Assessment & Plan:  Low-risk pregnancy of a 22 y.o., G2P1001 at [redacted]w[redacted]d with an Estimated Date of Delivery: 07/19/22   1. Supervision of other normal  pregnancy, antepartum -Anticipatory guidance for upcoming appts. -Patient to schedule next appt in 2 weeks for an in-person visit.  2. [redacted] weeks gestation of pregnancy -Doing well -Expresses desire for BTL. -Informed that consent papers and discussion with MD needs to occur. -Plan to sign papers today and schedule next visit with MD.  -Discussed usage of maternity belt/band for relief of pelvic pressure.   3. History of gestational hypertension -Taking bASA -BP normotensive today and throughout pregnancy thus far.  4. BMI 40.0-44.9, adult (HCC) -TWG 22lbs today -FH 35 -Scheduled for growth and BPP April 10     Meds: No orders of the defined types were placed in this encounter.  Labs/procedures today:  Lab Orders  No laboratory test(s) ordered today     Reviewed: Preterm labor symptoms and general obstetric precautions including but not limited to vaginal bleeding, contractions, leaking of fluid and fetal movement were reviewed in detail with the patient.  All questions were answered.  Follow-up: No follow-ups on file.  No orders of the defined types were placed in this encounter.  Maryann Conners MSN, CNM 05/23/2022

## 2022-05-23 NOTE — Progress Notes (Signed)
Pt presents for ROB visit. Pt c/o increased abdominal and back pain. No other concerns.

## 2022-06-06 ENCOUNTER — Ambulatory Visit (INDEPENDENT_AMBULATORY_CARE_PROVIDER_SITE_OTHER): Payer: Medicaid Other | Admitting: Obstetrics & Gynecology

## 2022-06-06 ENCOUNTER — Other Ambulatory Visit (HOSPITAL_COMMUNITY)
Admission: RE | Admit: 2022-06-06 | Discharge: 2022-06-06 | Disposition: A | Discharge status: Home / Self Care | Payer: Medicaid Other | Source: Ambulatory Visit | Attending: Obstetrics & Gynecology | Admitting: Obstetrics & Gynecology

## 2022-06-06 VITALS — BP 118/77 | HR 114 | Wt 267.0 lb

## 2022-06-06 DIAGNOSIS — Z3480 Encounter for supervision of other normal pregnancy, unspecified trimester: Secondary | ICD-10-CM | POA: Diagnosis not present

## 2022-06-06 DIAGNOSIS — Z6841 Body Mass Index (BMI) 40.0 and over, adult: Secondary | ICD-10-CM

## 2022-06-06 MED ORDER — FERROUS SULFATE 325 (65 FE) MG PO TABS
325.0000 mg | ORAL_TABLET | ORAL | 3 refills | Status: DC
Start: 2022-06-06 — End: 2022-07-15

## 2022-06-06 NOTE — Progress Notes (Addendum)
Pt presents for ROB. Reports vaginal pressure with no relief with maternity.  Reports pink discharge and one episode of severe back pain about 2 weeks ago.

## 2022-06-06 NOTE — Progress Notes (Signed)
   PRENATAL VISIT NOTE  Subjective:  Sydney Kelley is a 22 y.o. G2P1001 at [redacted]w[redacted]d being seen today for ongoing prenatal care.  She is currently monitored for the following issues for this high-risk pregnancy and has LGSIL on Pap smear of cervix; History of gestational hypertension; Supervision of other normal pregnancy, antepartum; and BMI 40.0-44.9, adult on their problem list.  Patient reports vaginal irritation.  Contractions: Irritability. Vag. Bleeding: None.  Movement: Present. Denies leaking of fluid.   The following portions of the patient's history were reviewed and updated as appropriate: allergies, current medications, past family history, past medical history, past social history, past surgical history and problem list.   Objective:   Vitals:   06/06/22 1335  BP: 118/77  Pulse: (!) 114  Weight: 267 lb (121.1 kg)    Fetal Status:     Movement: Present     General:  Alert, oriented and cooperative. Patient is in no acute distress.  Skin: Skin is warm and dry. No rash noted.   Cardiovascular: Normal heart rate noted  Respiratory: Normal respiratory effort, no problems with respiration noted  Abdomen: Soft, gravid, appropriate for gestational age.  Pain/Pressure: Absent     Pelvic: Cervical exam deferred        Extremities: Normal range of motion.  Edema: Trace  Mental Status: Normal mood and affect. Normal behavior. Normal judgment and thought content.   Assessment and Plan:  Pregnancy: G2P1001 at [redacted]w[redacted]d 1. Supervision of other normal pregnancy, antepartum   2. BMI 40.0-44.9, adult BPP weekly now  Preterm labor symptoms and general obstetric precautions including but not limited to vaginal bleeding, contractions, leaking of fluid and fetal movement were reviewed in detail with the patient. Please refer to After Visit Summary for other counseling recommendations.   Return in about 2 weeks (around 06/20/2022).  Future Appointments  Date Time Provider  [place]  06/07/2022  1:00 PM [facility] NURSE [facility]  06/07/2022  1:15 PM [facility] NST [facility]  06/14/2022  2:30 PM [facility] NURSE [facility]  06/14/2022  2:45 PM [facility] US5 [facility]  06/20/2022  1:50 PM Griffin Basil, MD [facility] None  06/21/2022  2:00 PM [facility] NURSE [facility]  06/21/2022  2:15 PM [facility] NST [facility]  06/28/2022  3:30 PM [facility] NURSE [facility]  06/28/2022  3:45 PM [facility] US4 [facility]  07/05/2022  1:15 PM [facility] NURSE [facility]  07/05/2022  1:30 PM [facility] US2 [facility]  07/14/2022 12:30 PM [facility] NURSE [facility]  07/14/2022 12:45 PM [facility] US4 [facility]    Emeterio Reeve, MD

## 2022-06-06 NOTE — Addendum Note (Signed)
Addended by: Woodroe Mode on: 06/06/2022 03:35 PM   Modules accepted: Orders

## 2022-06-07 ENCOUNTER — Ambulatory Visit (HOSPITAL_BASED_OUTPATIENT_CLINIC_OR_DEPARTMENT_OTHER): Payer: Medicaid Other | Admitting: *Deleted

## 2022-06-07 ENCOUNTER — Ambulatory Visit: Payer: Medicaid Other | Attending: Obstetrics | Admitting: *Deleted

## 2022-06-07 DIAGNOSIS — O99213 Obesity complicating pregnancy, third trimester: Secondary | ICD-10-CM | POA: Insufficient documentation

## 2022-06-07 DIAGNOSIS — Z3A34 34 weeks gestation of pregnancy: Secondary | ICD-10-CM | POA: Diagnosis not present

## 2022-06-07 DIAGNOSIS — E669 Obesity, unspecified: Secondary | ICD-10-CM | POA: Diagnosis not present

## 2022-06-07 DIAGNOSIS — O09293 Supervision of pregnancy with other poor reproductive or obstetric history, third trimester: Secondary | ICD-10-CM

## 2022-06-07 DIAGNOSIS — O26893 Other specified pregnancy related conditions, third trimester: Secondary | ICD-10-CM | POA: Insufficient documentation

## 2022-06-07 LAB — CERVICOVAGINAL ANCILLARY ONLY
Bacterial Vaginitis (gardnerella): NEGATIVE
Candida Glabrata: NEGATIVE
Candida Vaginitis: NEGATIVE
Chlamydia: NEGATIVE
Comment: NEGATIVE
Comment: NEGATIVE
Comment: NEGATIVE
Comment: NEGATIVE
Comment: NEGATIVE
Comment: NORMAL
Neisseria Gonorrhea: NEGATIVE
Trichomonas: NEGATIVE

## 2022-06-07 NOTE — Procedures (Signed)
[place] [address] [place] Obst April 21, 2000 [redacted]w[redacted]d  Fetus A Non-Stress Test Interpretation for 06/07/22  Indication:  obese  Fetal Heart Rate A Mode: External Baseline Rate (A): 145 bpm Variability: Moderate Accelerations: 15 x 15 Decelerations: None Multiple birth?: No  Uterine Activity Mode: Toco Contraction Frequency (min): none Resting Tone Palpated: Relaxed  Interpretation (Fetal Testing) Nonstress Test Interpretation: Reactive Overall Impression: Reassuring for gestational age Comments: Tracing reviewed by Dr. Donalee Citrin

## 2022-06-14 ENCOUNTER — Ambulatory Visit: Payer: Medicaid Other | Attending: Obstetrics

## 2022-06-14 ENCOUNTER — Ambulatory Visit: Payer: Medicaid Other | Admitting: *Deleted

## 2022-06-14 VITALS — BP 125/67 | HR 109

## 2022-06-14 DIAGNOSIS — O1493 Unspecified pre-eclampsia, third trimester: Secondary | ICD-10-CM | POA: Diagnosis not present

## 2022-06-14 DIAGNOSIS — E669 Obesity, unspecified: Secondary | ICD-10-CM | POA: Diagnosis not present

## 2022-06-14 DIAGNOSIS — O99213 Obesity complicating pregnancy, third trimester: Secondary | ICD-10-CM | POA: Diagnosis present

## 2022-06-14 DIAGNOSIS — Z3480 Encounter for supervision of other normal pregnancy, unspecified trimester: Secondary | ICD-10-CM | POA: Insufficient documentation

## 2022-06-14 DIAGNOSIS — Z3A35 35 weeks gestation of pregnancy: Secondary | ICD-10-CM | POA: Insufficient documentation

## 2022-06-14 DIAGNOSIS — O09293 Supervision of pregnancy with other poor reproductive or obstetric history, third trimester: Secondary | ICD-10-CM | POA: Diagnosis not present

## 2022-06-20 ENCOUNTER — Other Ambulatory Visit (HOSPITAL_COMMUNITY)
Admission: RE | Admit: 2022-06-20 | Discharge: 2022-06-20 | Disposition: A | Discharge status: Home / Self Care | Payer: Medicaid Other | Source: Ambulatory Visit | Attending: Obstetrics and Gynecology | Admitting: Obstetrics and Gynecology

## 2022-06-20 ENCOUNTER — Ambulatory Visit (INDEPENDENT_AMBULATORY_CARE_PROVIDER_SITE_OTHER): Payer: Medicaid Other | Admitting: Obstetrics and Gynecology

## 2022-06-20 VITALS — BP 123/83 | HR 97 | Wt 270.0 lb

## 2022-06-20 DIAGNOSIS — Z8759 Personal history of other complications of pregnancy, childbirth and the puerperium: Secondary | ICD-10-CM

## 2022-06-20 DIAGNOSIS — Z3480 Encounter for supervision of other normal pregnancy, unspecified trimester: Secondary | ICD-10-CM | POA: Diagnosis not present

## 2022-06-20 DIAGNOSIS — Z6841 Body Mass Index (BMI) 40.0 and over, adult: Secondary | ICD-10-CM

## 2022-06-20 DIAGNOSIS — Z3A35 35 weeks gestation of pregnancy: Secondary | ICD-10-CM

## 2022-06-20 NOTE — Progress Notes (Signed)
   PRENATAL VISIT NOTE  Subjective:  Sydney Kelley is a 22 y.o. G2P1001 at [redacted]w[redacted]d being seen today for ongoing prenatal care.  She is currently monitored for the following issues for this low-risk pregnancy and has LGSIL on Pap smear of cervix; History of gestational hypertension; Supervision of other normal pregnancy, antepartum; and BMI 40.0-44.9, adult on their problem list.  Patient doing well with no acute concerns today. She reports no complaints.  Contractions: Not present. Vag. Bleeding: None.  Movement: Present. Denies leaking of fluid.   The following portions of the patient's history were reviewed and updated as appropriate: allergies, current medications, past family history, past medical history, past social history, past surgical history and problem list. Problem list updated.  Objective:   Vitals:   06/20/22 1404  BP: 123/83  Pulse: 97  Weight: 270 lb (122.5 kg)    Fetal Status: Fetal Heart Rate (bpm): 150 Fundal Height: 38 cm Movement: Present     General:  Alert, oriented and cooperative. Patient is in no acute distress.  Skin: Skin is warm and dry. No rash noted.   Cardiovascular: Normal heart rate noted  Respiratory: Normal respiratory effort, no problems with respiration noted  Abdomen: Soft, gravid, appropriate for gestational age.  Pain/Pressure: Present     Pelvic: Cervical exam performed Dilation: Fingertip Effacement (%): Thick Station: Ballotable  Extremities: Normal range of motion.     Mental Status:  Normal mood and affect. Normal behavior. Normal judgment and thought content.   Assessment and Plan:  Pregnancy: G2P1001 at [redacted]w[redacted]d  1. Supervision of other normal pregnancy, antepartum Continue routine prenatal care  - Culture, beta strep (group b only) - Cervicovaginal ancillary only( [facility])  2. [redacted] weeks gestation of pregnancy   3. History of gestational hypertension BP WNL today  4. BMI 40.0-44.9, adult   Preterm labor  symptoms and general obstetric precautions including but not limited to vaginal bleeding, contractions, leaking of fluid and fetal movement were reviewed in detail with the patient.  Please refer to After Visit Summary for other counseling recommendations.   Return in about 1 week (around 06/27/2022) for ROB, in person.   Mariel Aloe, MD Faculty Attending [facility]

## 2022-06-21 ENCOUNTER — Ambulatory Visit: Payer: Medicaid Other

## 2022-06-21 ENCOUNTER — Ambulatory Visit: Payer: Medicaid Other | Admitting: *Deleted

## 2022-06-21 ENCOUNTER — Ambulatory Visit: Payer: Medicaid Other | Attending: Obstetrics and Gynecology | Admitting: *Deleted

## 2022-06-21 VITALS — BP 120/72 | HR 91

## 2022-06-21 DIAGNOSIS — Z3A36 36 weeks gestation of pregnancy: Secondary | ICD-10-CM | POA: Diagnosis not present

## 2022-06-21 DIAGNOSIS — O99213 Obesity complicating pregnancy, third trimester: Secondary | ICD-10-CM | POA: Diagnosis not present

## 2022-06-21 LAB — CERVICOVAGINAL ANCILLARY ONLY
Chlamydia: NEGATIVE
Comment: NEGATIVE
Comment: NORMAL
Neisseria Gonorrhea: NEGATIVE

## 2022-06-21 NOTE — Procedures (Signed)
Sydney Kelley [address] Sydney Kelley 29-Sep-2000 [redacted]w[redacted]d  Fetus A Non-Stress Test Interpretation for 06/21/22  Indication:  morbidly obese  Fetal Heart Rate A Mode: External Baseline Rate (A): 135 bpm Variability: Moderate Accelerations: 15 x 15 Decelerations: None Multiple birth?: No  Uterine Activity Mode: Toco Contraction Frequency (min): none Resting Tone Palpated: Relaxed  Interpretation (Fetal Testing) Nonstress Test Interpretation: Reactive Overall Impression: Reassuring for gestational age Comments: Tracing reviewed by Dr. Judeth Cornfield

## 2022-06-24 LAB — CULTURE, BETA STREP (GROUP B ONLY): Strep Gp B Culture: NEGATIVE

## 2022-06-27 ENCOUNTER — Encounter (HOSPITAL_COMMUNITY): Payer: Self-pay | Admitting: Obstetrics & Gynecology

## 2022-06-27 ENCOUNTER — Inpatient Hospital Stay (HOSPITAL_COMMUNITY)
Admission: AD | Admit: 2022-06-27 | Discharge: 2022-06-27 | Disposition: A | Discharge status: Home / Self Care | Payer: Medicaid Other | Attending: Obstetrics & Gynecology | Admitting: Obstetrics & Gynecology

## 2022-06-27 ENCOUNTER — Ambulatory Visit (INDEPENDENT_AMBULATORY_CARE_PROVIDER_SITE_OTHER): Payer: Medicaid Other | Admitting: Obstetrics

## 2022-06-27 ENCOUNTER — Encounter: Payer: Self-pay | Admitting: Obstetrics

## 2022-06-27 VITALS — BP 121/77 | HR 102 | Wt 273.0 lb

## 2022-06-27 DIAGNOSIS — O3660X0 Maternal care for excessive fetal growth, unspecified trimester, not applicable or unspecified: Secondary | ICD-10-CM

## 2022-06-27 DIAGNOSIS — O4703 False labor before 37 completed weeks of gestation, third trimester: Secondary | ICD-10-CM | POA: Insufficient documentation

## 2022-06-27 DIAGNOSIS — O479 False labor, unspecified: Secondary | ICD-10-CM | POA: Diagnosis not present

## 2022-06-27 DIAGNOSIS — Z3480 Encounter for supervision of other normal pregnancy, unspecified trimester: Secondary | ICD-10-CM

## 2022-06-27 DIAGNOSIS — E668 Other obesity: Secondary | ICD-10-CM

## 2022-06-27 DIAGNOSIS — Z3A36 36 weeks gestation of pregnancy: Secondary | ICD-10-CM | POA: Insufficient documentation

## 2022-06-27 DIAGNOSIS — O99210 Obesity complicating pregnancy, unspecified trimester: Secondary | ICD-10-CM

## 2022-06-27 LAB — URINALYSIS, ROUTINE W REFLEX MICROSCOPIC
Bilirubin Urine: NEGATIVE
Glucose, UA: NEGATIVE mg/dL
Hgb urine dipstick: NEGATIVE
Ketones, ur: NEGATIVE mg/dL
Nitrite: NEGATIVE
Protein, ur: 30 mg/dL — AB
Specific Gravity, Urine: 1.019 (ref 1.005–1.030)
pH: 6 (ref 5.0–8.0)

## 2022-06-27 NOTE — Progress Notes (Signed)
Pt presents for ROB without concerns today.  GBS and GC/CT all negative

## 2022-06-27 NOTE — MAU Note (Signed)
.  Sydney Kelley [address] Sydney Kelley is a 22 y.o. at [redacted]w[redacted]d here in MAU reporting: around 1200 she started having intermittent contractions in her back and pressure in her pelvis. States she feels like she needs to have a bowel movement, but cannot. Denies VB or LOF. +FM.  Pain score: 7 Vitals:   06/27/22 1845  BP: 114/60  Pulse: (!) 105  Resp: 14  Temp: 98.1 F (36.7 C)  SpO2: 98%     FHT:139 Lab orders placed from triage:  UA

## 2022-06-27 NOTE — Progress Notes (Signed)
Subjective:  Sydney Kelley is a 22 y.o. G2P1001 at [redacted]w[redacted]d being seen today for ongoing prenatal care.  She is currently monitored for the following issues for this low-risk pregnancy and has LGSIL on Pap smear of cervix; History of gestational hypertension; Supervision of other normal pregnancy, antepartum; and BMI 40.0-44.9, adult on their problem list.  Patient reports  seasonal allergies .  Contractions: Not present. Vag. Bleeding: None.  Movement: Present. Denies leaking of fluid.   The following portions of the patient's history were reviewed and updated as appropriate: allergies, current medications, past family history, past medical history, past social history, past surgical history and problem list. Problem list updated.  Objective:   Vitals:   06/27/22 1119  BP: 121/77  Pulse: (!) 102  Weight: 273 lb (123.8 kg)    Fetal Status: Fetal Heart Rate (bpm): 139   Movement: Present     General:  Alert, oriented and cooperative. Patient is in no acute distress.  Skin: Skin is warm and dry. No rash noted.   Cardiovascular: Normal heart rate noted  Respiratory: Normal respiratory effort, no problems with respiration noted  Abdomen: Soft, gravid, appropriate for gestational age. Pain/Pressure: Present     Pelvic:  Cervical exam deferred        Extremities: Normal range of motion.  Edema: Trace  Mental Status: Normal mood and affect. Normal behavior. Normal judgment and thought content.   Urinalysis:        [place] MFM OB FOLLOW UP (Accession [id number]) (Order [id number]) Imaging Date: 06/14/2022 Department: [facility] Released By: Elesa Hacker Authorizing: Barton Dubois, MD   Exam Status  Status  Final [99]   PACS Intelerad Image Link   Show images for [place] MFM OB FOLLOW UP Study Result  Narrative & Impression  ----------------------------------------------------------------------  OBSTETRICS REPORT                        (Signed Final 06/14/2022 03:49 pm) ---------------------------------------------------------------------- Patient Info  ID #:       [id number]                          D.O.B.:  2000-05-11 (21 yrs)  Name:       Sydney Kelley             Visit Date: 06/14/2022 02:55 pm              [place] ---------------------------------------------------------------------- Performed By  Attending:        Ma Rings MD         Ref. Address:     [address]                                                             [phone]  [place]                                                             [address]  Performed By:     Anabel Halon          [facility]  Referred By:      [facility] ---------------------------------------------------------------------- Orders  #  Description                           Code        Ordered By  1  [place] MFM FETAL BPP WO NON               76819.01    YU FANG     STRESS  2  [place] MFM OB FOLLOW UP                   [id number]    YU FANG ----------------------------------------------------------------------  #  Order #                     Accession #                Episode #  1  [id number]                   [id number]                 [id number]  2  [id number]                   [id number]                 [id number] ---------------------------------------------------------------------- Indications  Obesity complicating pregnancy, third          O99.213  trimester  Poor obstetric history: Previous               O09.299  preeclampsia / eclampsia/gestational HTN  [redacted] weeks gestation of pregnancy                Z3A.35  Low  risk NIPS, neg AFP/Horizon ---------------------------------------------------------------------- Fetal Evaluation  Num Of Fetuses:  1  Fetal Heart Rate(bpm):  128  Cardiac Activity:       Observed  Presentation:           Cephalic  Placenta:               Posterior  P. Cord Insertion:      Previously visualized  AFI Sum(cm)     %Tile       Largest Pocket(cm)  14.91           54          4.35  RUQ(cm)       RLQ(cm)       LUQ(cm)        LLQ(cm)  4.29          2.99          3.28           4.35 ---------------------------------------------------------------------- Biophysical Evaluation  Amniotic F.V:   Pocket => 2 cm             F. Tone:        Observed  F. Movement:    Observed                   Score:          8/8  F. Breathing:   Observed ---------------------------------------------------------------------- Biometry  BPD:      93.1  mm     G. Age:  37w 6d         99  %    CI:        80.08   %    70 - 86                                                          FL/HC:      20.7   %    20.1 - 22.3  HC:      328.7  mm     G. Age:  37w 3d         75  %    HC/AC:      0.96        0.93 - 1.11  AC:      341.6  mm     G. Age:  38w 0d       > 99  %    FL/BPD:     73.3   %    71 - 87  FL:       68.2  mm     G. Age:  35w 0d         43  %    FL/AC:      20.0   %    20 - 24  LV:        4.6  mm  Est. FW:    3161  gm    6 lb 15 oz      96  % ---------------------------------------------------------------------- Gestational Age  U/S Today:     37w 1d                                        EDD:   07/04/22  Best:  35w 0d     Det. By:  U/S C R L  (12/07/21)    EDD:   07/19/22 ---------------------------------------------------------------------- Anatomy  Cranium:               Appears normal         LVOT:                   Previously seen  Cavum:                 Previously seen        Aortic Arch:            Previously seen  Ventricles:            Previously seen        Ductal Arch:             Previously seen  Choroid Plexus:        Previously seen        Diaphragm:              Appears normal  Cerebellum:            Previously seen        Stomach:                Appears normal, left                                                                        sided  Posterior Fossa:       Previously seen        Abdomen:                Previously seen  Nuchal Fold:           Previously seen        Abdominal Wall:         Previously seen  Face:                  Orbits and profile     Cord Vessels:           Previously seen                         previously seen  Lips:                  Previously seen        Kidneys:                Appear normal  Palate:                Previously             Bladder:                Appears normal                         visualized  Thoracic:              Previously seen        Spine:                  Previously seen  Heart:  Previously seen        Upper Extremities:      Previously seen  RVOT:                  Previously seen        Lower Extremities:      Previously seen ---------------------------------------------------------------------- Cervix Uterus Adnexa  Cervix  Not visualized (advanced GA >24wks)  Uterus  No abnormality visualized.  Right Ovary  Within normal limits.  Left Ovary  Not visualized.  Cul De Sac  No free fluid seen.  Adnexa  No abnormality visualized ---------------------------------------------------------------------- Comments  This patient was seen for a follow up growth scan and BPP  due to maternal obesity with a BMI of 42.  She denies any  problems since her last exam and has screened negative for  gestational diabetes.  She was informed that the fetal growth (EFW 6 pounds 15  ounces, 96 percentile) measures large for her gestational  age.  There was normal amniotic fluid noted.  A BPP performed today was 8 out of 8.  She will return in 1 week for an  NST. ----------------------------------------------------------------------                   Ma Rings, MD Electronically Signed Final Report   06/14/2022 03:49 pm ----------------------------------------------------------------------      Assessment and Plan:  Pregnancy: G2P1001 at [redacted]w[redacted]d  1. Supervision of other normal pregnancy, antepartum  2. Excessive fetal growth affecting management of pregnancy, antepartum, single or unspecified fetus - ultrasound for interval growth pending - EFW at 96 th % at 36 weeks, normal AFI  3. Other obesity affecting pregnancy, antepartum   Preterm labor symptoms and general obstetric precautions including but not limited to vaginal bleeding, contractions, leaking of fluid and fetal movement were reviewed in detail with the patient. Please refer to After Visit Summary for other counseling recommendations.   Return in about 1 week (around 07/04/2022) for ROB.   Brock Bad, MD 06/27/22

## 2022-06-27 NOTE — Progress Notes (Signed)
Labor Check  S: Ms. Sydney Kelley is a 22 y.o. G2P1001 at [redacted]w[redacted]d  who presents to MAU today reporting intermittent contractions in her back and pressure in her pelvis. She denies vaginal bleeding. She denies LOF. She reports normal fetal movement.    O: BP 114/60 (BP Location: Right Arm)   Pulse (!) 105   Temp 98.1 F (36.7 C) (Oral)   Resp 14   LMP  (LMP Unknown)   SpO2 98%   Cervical exam:  Dilation: Fingertip Effacement (%): Thick Cervical Position: Posterior Exam by:: Zenia Resides, RN   Fetal Monitoring: Baseline: 135 Variability: moderate Accelerations: 15x15 Decelerations: absent Contractions: irregular   A: SIUP at [redacted]w[redacted]d  False labor UA unremarkable, BP wnl Cat I tracing  P: Discharge with return precautions   Vonna Drafts, MD 06/27/2022 7:59 PM

## 2022-06-28 ENCOUNTER — Ambulatory Visit: Payer: Medicaid Other | Attending: Obstetrics

## 2022-06-28 ENCOUNTER — Ambulatory Visit: Payer: Medicaid Other | Admitting: *Deleted

## 2022-06-28 VITALS — BP 133/68 | HR 114

## 2022-06-28 DIAGNOSIS — O09293 Supervision of pregnancy with other poor reproductive or obstetric history, third trimester: Secondary | ICD-10-CM | POA: Diagnosis not present

## 2022-06-28 DIAGNOSIS — E669 Obesity, unspecified: Secondary | ICD-10-CM | POA: Diagnosis not present

## 2022-06-28 DIAGNOSIS — O99213 Obesity complicating pregnancy, third trimester: Secondary | ICD-10-CM | POA: Insufficient documentation

## 2022-06-28 DIAGNOSIS — O3663X0 Maternal care for excessive fetal growth, third trimester, not applicable or unspecified: Secondary | ICD-10-CM | POA: Diagnosis not present

## 2022-06-28 DIAGNOSIS — Z3A37 37 weeks gestation of pregnancy: Secondary | ICD-10-CM

## 2022-07-04 ENCOUNTER — Ambulatory Visit (INDEPENDENT_AMBULATORY_CARE_PROVIDER_SITE_OTHER): Payer: Medicaid Other | Admitting: Obstetrics

## 2022-07-04 ENCOUNTER — Encounter: Payer: Self-pay | Admitting: Obstetrics

## 2022-07-04 VITALS — BP 111/73 | HR 96 | Wt 271.0 lb

## 2022-07-04 DIAGNOSIS — O0990 Supervision of high risk pregnancy, unspecified, unspecified trimester: Secondary | ICD-10-CM

## 2022-07-04 DIAGNOSIS — O3663X0 Maternal care for excessive fetal growth, third trimester, not applicable or unspecified: Secondary | ICD-10-CM

## 2022-07-04 DIAGNOSIS — O99213 Obesity complicating pregnancy, third trimester: Secondary | ICD-10-CM

## 2022-07-04 DIAGNOSIS — O0993 Supervision of high risk pregnancy, unspecified, third trimester: Secondary | ICD-10-CM

## 2022-07-04 DIAGNOSIS — Z3A37 37 weeks gestation of pregnancy: Secondary | ICD-10-CM

## 2022-07-04 DIAGNOSIS — E668 Other obesity: Secondary | ICD-10-CM

## 2022-07-04 DIAGNOSIS — O3660X0 Maternal care for excessive fetal growth, unspecified trimester, not applicable or unspecified: Secondary | ICD-10-CM

## 2022-07-04 NOTE — Progress Notes (Signed)
ROB, Pt wants cervix checked.

## 2022-07-04 NOTE — Progress Notes (Signed)
Subjective:  Sydney Kelley Sydney Kelley is a 22 y.o. G2P1001 at [redacted]w[redacted]d being seen today for ongoing prenatal care.  She is currently monitored for the following issues for this high-risk pregnancy and has LGSIL on Pap smear of cervix; History of gestational hypertension; Supervision of other normal pregnancy, antepartum; and BMI 40.0-44.9, adult (HCC) on their problem list.  Patient reports occasional contractions.  Contractions: Irritability. Vag. Bleeding: None.  Movement: Present. Denies leaking of fluid.   The following portions of the patient's history were reviewed and updated as appropriate: allergies, current medications, past family history, past medical history, past social history, past surgical history and problem list. Problem list updated.  Objective:   Vitals:   07/04/22 1555  BP: 111/73  Pulse: 96  Weight: 271 lb (122.9 kg)    Fetal Status: Fetal Heart Rate (bpm): 156   Movement: Present     General:  Alert, oriented and cooperative. Patient is in no acute distress.  Skin: Skin is warm and dry. No rash noted.   Cardiovascular: Normal heart rate noted  Respiratory: Normal respiratory effort, no problems with respiration noted  Abdomen: Soft, gravid, appropriate for gestational age. Pain/Pressure: Present     Pelvic:  Cervical exam performed        Extremities: Normal range of motion.  Edema: Trace  Mental Status: Normal mood and affect. Normal behavior. Normal judgment and thought content.   Urinalysis:      Ultrasound:  [place] MFM FETAL BPP WO NON STRESS (Accession [id number]) (Order [id number]) Imaging Date: 06/28/2022 Department: [facility] Released By: Lauralee Evener D Authorizing: Barton Dubois, MD   Exam Status  Status  Final [99]   PACS Intelerad Image Link   Show images for [place] MFM FETAL BPP WO NON STRESS Study Result  Narrative & Impression  ----------------------------------------------------------------------   OBSTETRICS REPORT                       (Signed Final 06/28/2022 04:43 pm) ---------------------------------------------------------------------- Patient Info  ID #:       [id number]                          D.O.B.:  Dec 23, 2000 (21 yrs)  Name:       Sydney Kelley             Visit Date: 06/28/2022 03:43 pm              [place] ---------------------------------------------------------------------- Performed By  Attending:        Ma Rings MD         Ref. Address:     [address]                                                             [phone]  [place]                                                             [address]  Performed By:     Emeline Darling BS,      [facility]  Referred By:      [facility] ---------------------------------------------------------------------- Orders  #  Description                           Code        Ordered By  1  [place] MFM FETAL BPP WO NON               76819.01    YU FANG     STRESS ----------------------------------------------------------------------  #  Order #                     Accession #                Episode #  1  [id number]                   [id number]                 [id number] ---------------------------------------------------------------------- Indications  Obesity complicating pregnancy, third          O99.213  trimester (BMI 46)  Large for gestational age fetus affecting      O36.60X0  management of mother  [redacted] weeks gestation of pregnancy                Z3A.37  Poor obstetric history: Previous               O09.299  preeclampsia / eclampsia/gestational HTN  Low risk  NIPS, neg AFP/Horizon ---------------------------------------------------------------------- Vital Signs  BP:          133/68 ---------------------------------------------------------------------- Fetal Evaluation  Num Of Fetuses:         1  Fetal Heart Rate(bpm):  145  Cardiac Activity:       Observed  Presentation:           Cephalic  Placenta:  Posterior  P. Cord Insertion:      Previously visualized  Amniotic Fluid  AFI FV:      Within normal limits  AFI Sum(cm)     %Tile       Largest Pocket(cm)  9.77            22          4.43  RUQ(cm)                     LUQ(cm)        LLQ(cm)  4.43                        2.41           2.93 ---------------------------------------------------------------------- Biophysical Evaluation  Amniotic F.V:   Pocket => 2 cm             F. Tone:        Observed  F. Movement:    Observed                   Score:          8/8  F. Breathing:   Observed ---------------------------------------------------------------------- Gestational Age  Best:          37w 0d     Det. By:  U/S C R L  (12/07/21)    EDD:   07/19/22 ---------------------------------------------------------------------- Anatomy  Ventricles:            Appears normal         Abdomen:                Appears normal  Heart:                 Appears normal         Kidneys:                Appear normal                         (4CH, axis, and                         situs)  Diaphragm:             Appears normal         Bladder:                Appears normal  Stomach:               Appears normal, left                         sided ---------------------------------------------------------------------- Comments  This patient was seen for a BPP due to maternal obesity with  a BMI of 46.  She denies any problems since her last exam.  A biophysical profile performed today was 8/8.  The AFI was 9.77 cm (within normal limits).  As her BMI is greater than 40, delivery may be considered  at  any time between 39 to 40 weeks.  She will return in 1 week for another BPP. ----------------------------------------------------------------------                   Ma Rings, MD Electronically Signed Final Report   06/28/2022 04:43 pm ----------------------------------------------------------------------      Assessment and Plan:  Pregnancy: G2P1001 at [redacted]w[redacted]d  1. Supervision of  high risk pregnancy, antepartum  2. Excessive fetal growth affecting management of pregnancy, antepartum, single or unspecified fetus - EFW at 96 % tile at 35 weeks - IOL recommended at 39-40 weeks, per MFM  3. Other obesity affecting pregnancy, antepartum    Term labor symptoms and general obstetric precautions including but not limited to vaginal bleeding, contractions, leaking of fluid and fetal movement were reviewed in detail with the patient. Please refer to After Visit Summary for other counseling recommendations.   Return in about 1 week (around 07/11/2022) for [facility].   Brock Bad, MD

## 2022-07-05 ENCOUNTER — Ambulatory Visit: Payer: Medicaid Other | Attending: Obstetrics

## 2022-07-05 ENCOUNTER — Ambulatory Visit: Payer: Medicaid Other | Admitting: *Deleted

## 2022-07-05 ENCOUNTER — Telehealth (HOSPITAL_COMMUNITY): Payer: Self-pay | Admitting: *Deleted

## 2022-07-05 ENCOUNTER — Encounter (HOSPITAL_COMMUNITY): Payer: Self-pay | Admitting: *Deleted

## 2022-07-05 VITALS — BP 128/69 | HR 103

## 2022-07-05 DIAGNOSIS — Z3A38 38 weeks gestation of pregnancy: Secondary | ICD-10-CM

## 2022-07-05 DIAGNOSIS — E669 Obesity, unspecified: Secondary | ICD-10-CM

## 2022-07-05 DIAGNOSIS — O99213 Obesity complicating pregnancy, third trimester: Secondary | ICD-10-CM | POA: Diagnosis present

## 2022-07-05 DIAGNOSIS — O09293 Supervision of pregnancy with other poor reproductive or obstetric history, third trimester: Secondary | ICD-10-CM

## 2022-07-05 DIAGNOSIS — O3663X0 Maternal care for excessive fetal growth, third trimester, not applicable or unspecified: Secondary | ICD-10-CM | POA: Diagnosis not present

## 2022-07-05 NOTE — Telephone Encounter (Signed)
Preadmission screen  

## 2022-07-07 ENCOUNTER — Other Ambulatory Visit: Payer: Self-pay | Admitting: Advanced Practice Midwife

## 2022-07-12 ENCOUNTER — Ambulatory Visit: Payer: Medicaid Other

## 2022-07-12 ENCOUNTER — Ambulatory Visit: Payer: Medicaid Other | Attending: Maternal & Fetal Medicine

## 2022-07-12 ENCOUNTER — Other Ambulatory Visit: Payer: Self-pay | Admitting: Family Medicine

## 2022-07-12 ENCOUNTER — Other Ambulatory Visit: Payer: Self-pay | Admitting: Advanced Practice Midwife

## 2022-07-12 DIAGNOSIS — Z3490 Encounter for supervision of normal pregnancy, unspecified, unspecified trimester: Secondary | ICD-10-CM

## 2022-07-13 ENCOUNTER — Inpatient Hospital Stay (HOSPITAL_COMMUNITY)
Admission: RE | Admit: 2022-07-13 | Discharge: 2022-07-15 | DRG: 807 | Disposition: A | Discharge status: Home / Self Care | Payer: Medicaid Other | Attending: Obstetrics and Gynecology | Admitting: Obstetrics and Gynecology

## 2022-07-13 ENCOUNTER — Encounter (HOSPITAL_COMMUNITY): Payer: Self-pay | Admitting: Family Medicine

## 2022-07-13 ENCOUNTER — Inpatient Hospital Stay (HOSPITAL_COMMUNITY): Payer: Medicaid Other | Admitting: Anesthesiology

## 2022-07-13 ENCOUNTER — Inpatient Hospital Stay (HOSPITAL_COMMUNITY): Payer: Medicaid Other

## 2022-07-13 DIAGNOSIS — O99214 Obesity complicating childbirth: Secondary | ICD-10-CM | POA: Diagnosis present

## 2022-07-13 DIAGNOSIS — Z3A39 39 weeks gestation of pregnancy: Secondary | ICD-10-CM | POA: Diagnosis not present

## 2022-07-13 DIAGNOSIS — O3663X0 Maternal care for excessive fetal growth, third trimester, not applicable or unspecified: Secondary | ICD-10-CM | POA: Diagnosis present

## 2022-07-13 DIAGNOSIS — Z3490 Encounter for supervision of normal pregnancy, unspecified, unspecified trimester: Secondary | ICD-10-CM | POA: Diagnosis present

## 2022-07-13 HISTORY — DX: Depression, unspecified: F32.A

## 2022-07-13 LAB — CBC
HCT: 30.1 % — ABNORMAL LOW (ref 36.0–46.0)
Hemoglobin: 8.7 g/dL — ABNORMAL LOW (ref 12.0–15.0)
MCH: 21.5 pg — ABNORMAL LOW (ref 26.0–34.0)
MCHC: 28.9 g/dL — ABNORMAL LOW (ref 30.0–36.0)
MCV: 74.5 fL — ABNORMAL LOW (ref 80.0–100.0)
Platelets: 250 10*3/uL (ref 150–400)
RBC: 4.04 MIL/uL (ref 3.87–5.11)
RDW: 17.3 % — ABNORMAL HIGH (ref 11.5–15.5)
WBC: 11 10*3/uL — ABNORMAL HIGH (ref 4.0–10.5)
nRBC: 0.2 % (ref 0.0–0.2)

## 2022-07-13 LAB — TYPE AND SCREEN
ABO/RH(D): O POS
Antibody Screen: NEGATIVE

## 2022-07-13 MED ORDER — TERBUTALINE SULFATE 1 MG/ML IJ SOLN: 0.2500 mg | Freq: Once | INTRAMUSCULAR | Status: DC | PRN

## 2022-07-13 MED ORDER — MISOPROSTOL 50MCG HALF TABLET
50.0000 ug | ORAL_TABLET | Freq: Once | ORAL | Status: AC
  Administered 2022-07-13: 50 ug via ORAL
  Filled 2022-07-13: qty 1

## 2022-07-13 MED ORDER — ACETAMINOPHEN 325 MG PO TABS: 650.0000 mg | ORAL_TABLET | ORAL | Status: DC | PRN

## 2022-07-13 MED ORDER — LIDOCAINE HCL (PF) 1 % IJ SOLN: 30.0000 mL | INTRAMUSCULAR | Status: DC | PRN

## 2022-07-13 MED ORDER — PHENYLEPHRINE 80 MCG/ML (10ML) SYRINGE FOR IV PUSH (FOR BLOOD PRESSURE SUPPORT): 80.0000 ug | PREFILLED_SYRINGE | INTRAVENOUS | Status: DC | PRN

## 2022-07-13 MED ORDER — DIPHENHYDRAMINE HCL 50 MG/ML IJ SOLN: 12.5000 mg | INTRAMUSCULAR | Status: DC | PRN

## 2022-07-13 MED ORDER — OXYTOCIN-SODIUM CHLORIDE 30-0.9 UT/500ML-% IV SOLN: 2.5000 [IU]/h | INTRAVENOUS | Status: DC

## 2022-07-13 MED ORDER — OXYCODONE-ACETAMINOPHEN 5-325 MG PO TABS: 2.0000 | ORAL_TABLET | ORAL | Status: DC | PRN

## 2022-07-13 MED ORDER — LIDOCAINE HCL (PF) 1 % IJ SOLN
INTRAMUSCULAR | Status: DC | PRN
  Administered 2022-07-13: 11 mL via EPIDURAL

## 2022-07-13 MED ORDER — ONDANSETRON HCL 4 MG/2ML IJ SOLN: 4.0000 mg | Freq: Four times a day (QID) | INTRAMUSCULAR | Status: DC | PRN

## 2022-07-13 MED ORDER — EPHEDRINE 5 MG/ML INJ: 10.0000 mg | INTRAVENOUS | Status: DC | PRN

## 2022-07-13 MED ORDER — LACTATED RINGERS IV SOLN: 500.0000 mL | Freq: Once | INTRAVENOUS | Status: DC

## 2022-07-13 MED ORDER — OXYTOCIN BOLUS FROM INFUSION
333.0000 mL | Freq: Once | INTRAVENOUS | Status: AC
  Administered 2022-07-14: 333 mL via INTRAVENOUS

## 2022-07-13 MED ORDER — FENTANYL-BUPIVACAINE-NACL 0.5-0.125-0.9 MG/250ML-% EP SOLN
12.0000 mL/h | EPIDURAL | Status: DC | PRN
  Administered 2022-07-13: 12 mL/h via EPIDURAL
  Filled 2022-07-13: qty 250

## 2022-07-13 MED ORDER — LACTATED RINGERS IV SOLN: 500.0000 mL | INTRAVENOUS | Status: DC | PRN

## 2022-07-13 MED ORDER — SOD CITRATE-CITRIC ACID 500-334 MG/5ML PO SOLN: 30.0000 mL | ORAL | Status: DC | PRN

## 2022-07-13 MED ORDER — FENTANYL CITRATE (PF) 100 MCG/2ML IJ SOLN
50.0000 ug | INTRAMUSCULAR | Status: DC | PRN
  Administered 2022-07-13: 100 ug via INTRAVENOUS
  Filled 2022-07-13: qty 2

## 2022-07-13 MED ORDER — OXYCODONE-ACETAMINOPHEN 5-325 MG PO TABS: 1.0000 | ORAL_TABLET | ORAL | Status: DC | PRN

## 2022-07-13 MED ORDER — LACTATED RINGERS IV SOLN: INTRAVENOUS | Status: DC

## 2022-07-13 NOTE — Anesthesia Procedure Notes (Signed)
Epidural Patient location during procedure: OB Start time: 07/13/2022 11:17 PM End time: 07/13/2022 11:29 PM  Staffing Anesthesiologist: Lowella Curb, MD Performed: anesthesiologist   Preanesthetic Checklist Completed: patient identified, IV checked, site marked, risks and benefits discussed, surgical consent, monitors and equipment checked, pre-op evaluation and timeout performed  Epidural Patient position: sitting Prep: ChloraPrep Patient monitoring: heart rate, cardiac monitor, continuous pulse ox and blood pressure Approach: midline Location: L2-L3 Injection technique: LOR saline  Needle:  Needle type: Tuohy  Needle gauge: 17 G Needle length: 9 cm Needle insertion depth: 7 cm Catheter type: closed end flexible Catheter size: 20 Guage Catheter at skin depth: 12 cm Test dose: negative  Assessment Events: blood not aspirated, injection not painful, no injection resistance, no paresthesia and negative IV test  Additional Notes Reason for block:procedure for pain

## 2022-07-13 NOTE — Anesthesia Preprocedure Evaluation (Signed)
Anesthesia Evaluation  Patient identified by MRN, date of birth, ID band Patient awake    Reviewed: Allergy & Precautions, H&P , NPO status , Patient's Chart, lab work & pertinent test results  History of Anesthesia Complications Negative for: history of anesthetic complications  Airway Mallampati: II  TM Distance: >3 FB Neck ROM: full    Dental no notable dental hx.    Pulmonary neg pulmonary ROS   Pulmonary exam normal        Cardiovascular hypertension, Pt. on medications Normal cardiovascular exam Rhythm:regular Rate:Normal     Neuro/Psych    Depression    negative neurological ROS  negative psych ROS   GI/Hepatic negative GI ROS, Neg liver ROS,,,  Endo/Other    Morbid obesity  Renal/GU      Musculoskeletal   Abdominal  (+) + obese  Peds  Hematology  (+) Blood dyscrasia, anemia   Anesthesia Other Findings   Reproductive/Obstetrics (+) Pregnancy                             Anesthesia Physical Anesthesia Plan  ASA: III  Anesthesia Plan: Epidural   Post-op Pain Management:    Induction:   PONV Risk Score and Plan:   Airway Management Planned:   Additional Equipment:   Intra-op Plan:   Post-operative Plan:   Informed Consent: I have reviewed the patients History and Physical, chart, labs and discussed the procedure including the risks, benefits and alternatives for the proposed anesthesia with the patient or authorized representative who has indicated his/her understanding and acceptance.       Plan Discussed with:   Anesthesia Plan Comments:         Anesthesia Quick Evaluation

## 2022-07-13 NOTE — H&P (Signed)
Sydney Kelley is a 22 y.o. female G2P1001 with IUP at [redacted]w[redacted]d presenting for IOL for BMI 40. 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   Medical history non-contributory     Past Surgical History: Past Surgical History:  Procedure Laterality Date   NO PAST SURGERIES      Obstetrical History: OB History     Gravida  2   Para  1   Term  1   Preterm      AB      Living  1      SAB      IAB      Ectopic      Multiple  0   Live Births  1           Social History: Social History   Socioeconomic History   Marital status: Single    Spouse name: Not on file   Number of children: Not on file   Years of education: Not on file   Highest education level: Not on file  Occupational History   Not on file  Tobacco Use   Smoking status: Never   Smokeless tobacco: Never  Vaping Use   Vaping Use: Never used  Substance and Sexual Activity   Alcohol use: Never   Drug use: Never   Sexual activity: Yes    Partners: Male    Birth control/protection: None  Other Topics Concern   Not on file  Social History Narrative   Not on file   Social Determinants of Health   Financial Resource Strain: Not on file  Food Insecurity: No Food Insecurity (07/13/2022)   Hunger Vital Sign    Worried About Running Out of Food in the Last Year: Never true    Ran Out of Food in the Last Year: Never true  Transportation Needs: No Transportation Needs (07/13/2022)   PRAPARE - Administrator, Civil Service (Medical): No    Lack of Transportation (Non-Medical): No  Physical Activity: Not on file  Stress: Not on file  Social Connections: Not on file    Family History: Family History  Problem Relation Age of Onset   Diabetes Father    Diabetes Paternal Uncle    Cancer Other    Asthma Other     Heart disease Neg Hx    Hypertension Neg Hx     Allergies: No Known Allergies  Medications Prior to Admission  Medication Sig Dispense Refill Last Dose   aspirin EC 81 MG tablet Take 1 tablet (81 mg total) by mouth daily. Take after 12 weeks for prevention of preeclampsia later in pregnancy 300 tablet 2 Past Week   ferrous sulfate 325 (65 FE) MG tablet Take 1 tablet (325 mg total) by mouth every other day. 30 tablet 3 Past Month   Prenatal Vit-Fe Phos-FA-Omega (VITAFOL GUMMIES) 3.33-0.333-34.8 MG CHEW Chew 3 tablets by mouth daily. 90 tablet 11 Past Week   acetaminophen (TYLENOL) 500 MG tablet Take 1,000 mg by mouth every 6 (six) hours as needed for headache or mild pain.   More than a month   Misc. Devices (GOJJI WEIGHT SCALE) MISC 1 Device by Does not apply route every 30 (thirty) days. 1 each 0 More than a month        Review of Systems  Constitutional: Negative for fever and chills Eyes: Negative for visual disturbances Respiratory: Negative for shortness of breath, dyspnea Cardiovascular: Negative for chest pain or palpitations  Gastrointestinal: Negative for abdominal pain, vomiting, diarrhea and constipation.   Genitourinary: Negative for dysuria and urgency Musculoskeletal: Negative for back pain, joint pain, myalgias  Neurological: Negative for dizziness and headaches      Blood pressure 125/72, pulse (!) 101, temperature 98.7 F (37.1 C), temperature source Oral, resp. rate 20. General appearance: alert, cooperative, and no distress Lungs: normal respiratory effort Heart: regular rate and rhythm Abdomen: soft, non-tender; bowel sounds normal Extremities: Homans sign is negative, no sign of DVT DTR's 2+ Presentation: cephalic Fetal monitoring  Baseline: 140 bpm, Variability: Good {> 6 bpm), Accelerations: Reactive, and Decelerations: Absent Uterine activity  None  Dilation: 1.5 Effacement (%): 20 Station: -2 Exam by:: Drenda Freeze, CNM   Prenatal labs: ABO, Rh:  --/--/O POS (05/09 1955) Antibody: NEG (05/09 1955) Rubella: immune RPR: Non Reactive (02/27 0839)  HBsAg: Negative (10/04 1402)  HIV: Non Reactive (02/27 0839)  GBS: Negative/-- (04/16 1426)   Nursing Staff Provider  Office Location [facility] Dating   First trimester U/S  [facility] Model Arly.Keller ] Traditional [ ]  Centering [ ]  Mom-Baby Dyad Anatomy US   03/20/22, incomplete, f/u ordered  Language  English    Flu Vaccine   Declined 01/10/22 Genetic/Carrier Screen  NIPS:   Negative AFP:   negative Horizon:  TDaP Vaccine    Hgb A1C or  GTT Early 5.6 Third trimester normal 29 weeks  COVID Vaccine Vaccinated   LAB RESULTS   Rhogam  O/Positive/-- (10/04 1402)  Blood Type O/Positive/-- (10/04 1402) O positive  Baby Feeding Plan Bottle Antibody Negative (10/04 1402)negative  Contraception Yes -- BTL Rubella 2.34 (10/04 1402)immune  Circumcision Yes if a boy RPR Non Reactive (02/27 0839) negative  Pediatrician  [facility] HBsAg Negative (10/04 1402) negative  Support Person FOB HCVAb Non Reactive (10/04 1402) negative  Prenatal Classes  HIV Non Reactive (02/27 0839)   negative  BTL Consent  06/08/2022 GBS Negative/-- (04/16 1426)negative (For PCN allergy, check sensitivities)   VBAC Consent  Pap  11/23 normal       DME Rx Arly.Keller ] BP cuff Arly.Keller ] Weight Scale Waterbirth  [ ]  Class [ ]  Consent [ ]  CNM visit  PHQ9 & GAD7 [ X ] new OB [  ] 28 weeks  [X]  36 weeks Induction  [ ]  Orders Entered [ ] Foley Y/N   Prenatal Transfer Tool  Maternal Diabetes: No Genetic Screening: Normal Maternal Ultrasounds/Referrals: Normal Fetal Ultrasounds or other Referrals:  None Maternal Substance Abuse:  No Significant Maternal Medications:  None Significant Maternal Lab Results: Group B Strep negative    Results for orders placed or performed during the hospital encounter of 07/13/22 (from the past 24 hour(s))  CBC   Collection Time: 07/13/22  7:55 PM  Result Value Ref Range   WBC 11.0 (H) 4.0 - 10.5  K/uL   RBC 4.04 3.87 - 5.11 MIL/uL   Hemoglobin 8.7 (L) 12.0 - 15.0 g/dL   HCT 96.0 (L) 45.4 - 09.8 %   MCV 74.5 (L) 80.0 - 100.0 fL   MCH 21.5 (L) 26.0 - 34.0 pg   MCHC 28.9 (L) 30.0 - 36.0 g/dL   RDW 11.9 (H) 14.7 - 82.9 %   Platelets 250 150 - 400 K/uL   nRBC 0.2 0.0 - 0.2 %  Type and screen   Collection Time:  07/13/22  7:55 PM  Result Value Ref Range   ABO/RH(D) O POS    Antibody Screen NEG    Sample Expiration      07/16/2022,2359 Performed at [facility], [address] [place], [place] [address]     Assessment: Sydney Kelley is a 22 y.o. G2P1001 with an IUP at [redacted]w[redacted]d presenting for IOL for BMI 40.  Plan: #Labor: Oral Cytotec->Cooks catheter placed and inflated to 60cc incrementally->Plan AROM>pitocin #Pain:  Per request #FWB Cat 1  Jacklyn Shell 07/13/2022, 9:09 PM

## 2022-07-14 ENCOUNTER — Ambulatory Visit: Payer: Medicaid Other

## 2022-07-14 ENCOUNTER — Encounter (HOSPITAL_COMMUNITY): Payer: Self-pay | Admitting: Family Medicine

## 2022-07-14 DIAGNOSIS — Z3A39 39 weeks gestation of pregnancy: Secondary | ICD-10-CM

## 2022-07-14 DIAGNOSIS — O99214 Obesity complicating childbirth: Secondary | ICD-10-CM

## 2022-07-14 LAB — RPR: RPR Ser Ql: NONREACTIVE

## 2022-07-14 MED ORDER — IRON SUCROSE 500 MG IVPB - SIMPLE MED: 500.0000 mg | Freq: Once | INTRAVENOUS | Status: DC

## 2022-07-14 MED ORDER — EPINEPHRINE PF 1 MG/ML IJ SOLN: 0.3000 mg | Freq: Once | INTRAMUSCULAR | Status: DC | PRN

## 2022-07-14 MED ORDER — ALBUTEROL SULFATE (2.5 MG/3ML) 0.083% IN NEBU: 2.5000 mg | INHALATION_SOLUTION | Freq: Once | RESPIRATORY_TRACT | Status: DC | PRN

## 2022-07-14 MED ORDER — SODIUM CHLORIDE 0.9 % IV SOLN
500.0000 mg | Freq: Once | INTRAVENOUS | Status: AC
  Administered 2022-07-14: 500 mg via INTRAVENOUS
  Filled 2022-07-14: qty 25

## 2022-07-14 MED ORDER — METHYLERGONOVINE MALEATE 0.2 MG PO TABS: 0.2000 mg | ORAL_TABLET | ORAL | Status: DC | PRN

## 2022-07-14 MED ORDER — DIBUCAINE (PERIANAL) 1 % EX OINT: 1.0000 | TOPICAL_OINTMENT | CUTANEOUS | Status: DC | PRN

## 2022-07-14 MED ORDER — MEASLES, MUMPS & RUBELLA VAC IJ SOLR: 0.5000 mL | Freq: Once | INTRAMUSCULAR | Status: DC

## 2022-07-14 MED ORDER — SODIUM CHLORIDE 0.9 % IV BOLUS: 500.0000 mL | Freq: Once | INTRAVENOUS | Status: DC | PRN

## 2022-07-14 MED ORDER — BISACODYL 10 MG RE SUPP: 10.0000 mg | Freq: Every day | RECTAL | Status: DC | PRN

## 2022-07-14 MED ORDER — ONDANSETRON HCL 4 MG PO TABS: 4.0000 mg | ORAL_TABLET | ORAL | Status: DC | PRN

## 2022-07-14 MED ORDER — MEDROXYPROGESTERONE ACETATE 150 MG/ML IM SUSP: 150.0000 mg | INTRAMUSCULAR | Status: DC | PRN

## 2022-07-14 MED ORDER — HYDROCODONE-ACETAMINOPHEN 5-325 MG PO TABS: 1.0000 | ORAL_TABLET | ORAL | Status: DC | PRN

## 2022-07-14 MED ORDER — SIMETHICONE 80 MG PO CHEW: 80.0000 mg | CHEWABLE_TABLET | ORAL | Status: DC | PRN

## 2022-07-14 MED ORDER — OXYTOCIN-SODIUM CHLORIDE 30-0.9 UT/500ML-% IV SOLN
1.0000 m[IU]/min | INTRAVENOUS | Status: DC
  Administered 2022-07-14: 2 m[IU]/min via INTRAVENOUS
  Filled 2022-07-14: qty 500

## 2022-07-14 MED ORDER — DIPHENHYDRAMINE HCL 25 MG PO CAPS: 25.0000 mg | ORAL_CAPSULE | Freq: Four times a day (QID) | ORAL | Status: DC | PRN

## 2022-07-14 MED ORDER — FERROUS SULFATE 325 (65 FE) MG PO TABS
325.0000 mg | ORAL_TABLET | ORAL | Status: DC
  Administered 2022-07-14: 325 mg via ORAL
  Filled 2022-07-14: qty 1

## 2022-07-14 MED ORDER — DIPHENHYDRAMINE HCL 50 MG/ML IJ SOLN: 25.0000 mg | Freq: Once | INTRAMUSCULAR | Status: DC | PRN

## 2022-07-14 MED ORDER — PRENATAL MULTIVITAMIN CH
1.0000 | ORAL_TABLET | Freq: Every day | ORAL | Status: DC
  Administered 2022-07-14 – 2022-07-15 (×2): 1 via ORAL
  Filled 2022-07-14 (×2): qty 1

## 2022-07-14 MED ORDER — IBUPROFEN 600 MG PO TABS
600.0000 mg | ORAL_TABLET | Freq: Four times a day (QID) | ORAL | Status: DC
  Administered 2022-07-14 – 2022-07-15 (×5): 600 mg via ORAL
  Filled 2022-07-14 (×5): qty 1

## 2022-07-14 MED ORDER — OXYCODONE HCL 5 MG PO TABS
5.0000 mg | ORAL_TABLET | Freq: Four times a day (QID) | ORAL | Status: DC | PRN
  Administered 2022-07-14 – 2022-07-15 (×2): 5 mg via ORAL
  Filled 2022-07-14 (×2): qty 1

## 2022-07-14 MED ORDER — SENNOSIDES-DOCUSATE SODIUM 8.6-50 MG PO TABS
2.0000 | ORAL_TABLET | ORAL | Status: DC
  Administered 2022-07-14 – 2022-07-15 (×2): 2 via ORAL
  Filled 2022-07-14 (×2): qty 2

## 2022-07-14 MED ORDER — BENZOCAINE-MENTHOL 20-0.5 % EX AERO: 1.0000 | INHALATION_SPRAY | CUTANEOUS | Status: DC | PRN

## 2022-07-14 MED ORDER — SODIUM CHLORIDE 0.9 % IV SOLN: INTRAVENOUS | Status: DC | PRN

## 2022-07-14 MED ORDER — COCONUT OIL OIL: 1.0000 | TOPICAL_OIL | Status: DC | PRN

## 2022-07-14 MED ORDER — METHYLERGONOVINE MALEATE 0.2 MG/ML IJ SOLN: 0.2000 mg | INTRAMUSCULAR | Status: DC | PRN

## 2022-07-14 MED ORDER — ACETAMINOPHEN 325 MG PO TABS
650.0000 mg | ORAL_TABLET | ORAL | Status: DC | PRN
  Administered 2022-07-14 – 2022-07-15 (×3): 650 mg via ORAL
  Filled 2022-07-14 (×3): qty 2

## 2022-07-14 MED ORDER — WITCH HAZEL-GLYCERIN EX PADS: 1.0000 | MEDICATED_PAD | CUTANEOUS | Status: DC | PRN

## 2022-07-14 MED ORDER — FLEET ENEMA 7-19 GM/118ML RE ENEM: 1.0000 | ENEMA | Freq: Every day | RECTAL | Status: DC | PRN

## 2022-07-14 MED ORDER — ONDANSETRON HCL 4 MG/2ML IJ SOLN: 4.0000 mg | INTRAMUSCULAR | Status: DC | PRN

## 2022-07-14 MED ORDER — TETANUS-DIPHTH-ACELL PERTUSSIS 5-2.5-18.5 LF-MCG/0.5 IM SUSY: 0.5000 mL | PREFILLED_SYRINGE | Freq: Once | INTRAMUSCULAR | Status: DC

## 2022-07-14 MED ORDER — BUPIVACAINE HCL (PF) 0.25 % IJ SOLN
INTRAMUSCULAR | Status: DC | PRN
  Administered 2022-07-14: 10 mL via EPIDURAL

## 2022-07-14 MED ORDER — METHYLPREDNISOLONE SODIUM SUCC 125 MG IJ SOLR: 125.0000 mg | Freq: Once | INTRAMUSCULAR | Status: DC | PRN

## 2022-07-14 NOTE — Discharge Summary (Signed)
Postpartum Discharge Summary  Date of Service updated***     Patient Name: Sydney Kelley DOB: Jun 03, 2000 MRN: [id number]  Date of admission: 07/13/2022 Delivery date:07/14/2022  Delivering provider: Jacklyn Shell  Date of discharge: 07/14/2022  Admitting diagnosis: Encounter for induction of labor [Z34.90] Intrauterine pregnancy: [redacted]w[redacted]d     Secondary diagnosis:  Principal Problem:   Encounter for induction of labor  Additional problems: Obesity    Discharge diagnosis: Term Pregnancy Delivered                                              Post partum procedures:{Postpartum procedures:23558} Augmentation: Pitocin, Cytotec, and IP Foley Complications: None  Hospital course: Induction of Labor With Vaginal Delivery   22 y.o. yo G2P1001 at [redacted]w[redacted]d was admitted to the hospital 07/13/2022 for induction of labor.  Indication for induction:  obesity .  Patient had an labor course complicated by nothing Membrane Rupture Time/Date: 12:01 AM ,07/14/2022   Delivery Method:Vaginal, Spontaneous  Episiotomy: None  Lacerations:  None  Details of delivery can be found in separate delivery note.  Patient had a postpartum course complicated by***. Patient is discharged home 07/14/22.  Newborn Data: Birth date:07/14/2022  Birth time:7:06 AM  Gender:Female  Living status:Living  Apgars:8 ,9  Weight:   Magnesium Sulfate received: No BMZ received: No Rhophylac:N/A MMR:N/A T-DaP:{Tdap:23962} Flu: {WUJ:81191} Transfusion:{Transfusion received:30440034}  Physical exam  Vitals:   07/14/22 0600 07/14/22 0631 07/14/22 0718 07/14/22 0731  BP: 125/86 (!) 142/85 135/61 133/89  Pulse: 97  (!) 125 (!) 105  Resp:    16  Temp:      TempSrc:      SpO2:      Weight:      Height:       General: {Exam; general:21111117} Lochia: {Desc; appropriate/inappropriate:30686::"appropriate"} Uterine Fundus: {Desc; firm/soft:30687} Incision: {Exam; incision:21111123} DVT Evaluation: {Exam;  dvt:2111122} Labs: Lab Results  Component Value Date   WBC 11.0 (H) 07/13/2022   HGB 8.7 (L) 07/13/2022   HCT 30.1 (L) 07/13/2022   MCV 74.5 (L) 07/13/2022   PLT 250 07/13/2022      Latest Ref Rng & Units 01/10/2022    9:01 AM  CMP  Glucose 70 - 99 mg/dL 92   BUN 6 - 20 mg/dL 5   Creatinine 4.78 - 2.95 mg/dL 6.21   Sodium 308 - 657 mmol/L 136   Potassium 3.5 - 5.2 mmol/L 4.0   Chloride 96 - 106 mmol/L 105   CO2 20 - 29 mmol/L 19   Calcium 8.7 - 10.2 mg/dL 9.2   Total Protein 6.0 - 8.5 g/dL 6.5   Total Bilirubin 0.0 - 1.2 mg/dL <8.4   Alkaline Phos 44 - 121 IU/L 117   AST 0 - 40 IU/L 12   ALT 0 - 32 IU/L 9    Edinburgh Score:    06/15/2020    2:19 PM  Edinburgh Postnatal Depression Scale Screening Tool  I have been able to laugh and see the funny side of things. 0  I have looked forward with enjoyment to things. 0  I have blamed myself unnecessarily when things went wrong. 0  I have been anxious or worried for no good reason. 0  I have felt scared or panicky for no good reason. 0  Things have been getting on top of me. 0  I have been  so unhappy that I have had difficulty sleeping. 0  I have felt sad or miserable. 0  I have been so unhappy that I have been crying. 0  The thought of harming myself has occurred to me. 0  Edinburgh Postnatal Depression Scale Total 0     After visit meds:  Allergies as of 07/14/2022   No Known Allergies   Med Rec must be completed prior to using this [facility]***        Discharge home in stable condition Infant Feeding: {Baby feeding:23562} Infant Disposition:{CHL IP OB HOME WITH ZOXWRU:04540} Discharge instruction: per After Visit Summary and Postpartum booklet. Activity: Advance as tolerated. Pelvic rest for 6 weeks.  Diet: {OB JWJX:91478295} Future Appointments:No future appointments. Follow up Visit:   Please schedule this patient for a Virtual postpartum visit in 4 weeks with the following provider: Any  provider. Additional Postpartum F/U:  High risk pregnancy complicated by: nothing Delivery mode:  Vaginal, Spontaneous  Anticipated Birth Control:  BTL done PP  ***    07/14/2022 Jacklyn Shell, CNM

## 2022-07-14 NOTE — Progress Notes (Signed)
Patient desires permanent sterilization.  Other reversible forms of contraception were discussed with patient; she declines all other modalities. Risks of procedure discussed with patient including but not limited to: risk of regret, permanence of method, bleeding, infection, injury to surrounding organs and need for additional procedures.  Failure risk of 1-2 % with increased risk of ectopic gestation if pregnancy occurs was also discussed with patient. Given patient age I stressed risk of regret and also reviewed other contraceptive options, particularly LARC. Patient confirms her interest in BTL. Evaluated abdomen, patient is morbidly obese and fundus is not readily palpable. Discussed implications for success of procedure, complications, etc. Discussed laparoscopic approach. After this discussion patient elects interval laparoscopic procedure. Will send message to schedulers to arrange that.

## 2022-07-14 NOTE — Progress Notes (Signed)
Patient Vitals for the past 4 hrs:  BP Temp Temp src Pulse Resp SpO2 Height Weight  07/14/22 0003 (!) 141/69 -- -- 76 -- -- -- --  07/14/22 0001 (!) 141/69 -- -- 76 -- 99 % -- --  07/13/22 2356 134/68 98.4 F (36.9 C) Oral 82 20 99 % -- --  07/13/22 2355 -- -- -- -- -- -- 5\' 4"  (1.626 m) 125.6 kg  07/13/22 2351 -- -- -- -- -- 99 % -- --  07/13/22 2350 132/71 -- -- 89 -- -- -- --  07/13/22 2346 137/72 -- -- 84 -- 99 % -- --  07/13/22 2341 134/76 -- -- 85 -- 99 % -- --  07/13/22 2336 138/70 -- -- 85 -- -- -- --  07/13/22 2335 -- -- -- -- -- 98 % -- --  07/13/22 2331 138/70 -- -- 87 -- -- -- --  07/13/22 2330 -- -- -- -- -- 99 % -- --  07/13/22 2322 131/77 -- -- 95 18 100 % -- --  07/13/22 2320 -- -- -- -- -- 100 % -- --   Could only tolerate 30cc H20 in balloon and asked for it to be removed around 2300.  Cx 4/50/-2.  Got epidural, had SROM w/clear fluid around 2355.  IUPC placed. MVUs ~ 150 now, will see how SROM affects labor, start pitocin in a few hours if needed for augmentation.  FHR Cat 1.

## 2022-07-14 NOTE — Anesthesia Postprocedure Evaluation (Signed)
Anesthesia Post Note  Patient: Sydney Kelley Ricarda Frame  Procedure(s) Performed: AN AD HOC LABOR EPIDURAL     Patient location during evaluation: Mother Baby Anesthesia Type: Epidural Level of consciousness: awake, awake and alert and oriented Pain management: pain level controlled Vital Signs Assessment: post-procedure vital signs reviewed and stable Respiratory status: spontaneous breathing Cardiovascular status: stable Postop Assessment: no headache, no backache, able to ambulate and no apparent nausea or vomiting Anesthetic complications: no   No notable events documented.  Last Vitals:  Vitals:   07/14/22 1430 07/14/22 1754  BP: 115/61 (!) 116/51  Pulse: 85 72  Resp: 20 20  Temp: 37 C 36.9 C  SpO2:      Last Pain:  Vitals:   07/14/22 2017  TempSrc:   PainSc: 9    Pain Goal:                   Lorrene Reid

## 2022-07-15 MED ORDER — BENZOCAINE-MENTHOL 20-0.5 % EX AERO: 1.0000 | INHALATION_SPRAY | CUTANEOUS | 0 refills | Status: DC | PRN

## 2022-07-15 MED ORDER — SENNOSIDES-DOCUSATE SODIUM 8.6-50 MG PO TABS: 2.0000 | ORAL_TABLET | ORAL | 0 refills | Status: DC

## 2022-07-15 MED ORDER — ACETAMINOPHEN 325 MG PO TABS: 650.0000 mg | ORAL_TABLET | ORAL | 0 refills | Status: DC | PRN

## 2022-07-15 MED ORDER — IBUPROFEN 600 MG PO TABS: 600.0000 mg | ORAL_TABLET | Freq: Four times a day (QID) | ORAL | 0 refills | Status: DC

## 2022-07-15 NOTE — Progress Notes (Signed)
During time of discharge education, patient stated that she passed a grapefruit sized clot when using the bathroom. Dr. Nobie Putnam was notified. Dr. Nobie Putnam stated that he would come assess the patient and MD instructed to hold patient for a couple of hours to monitor prior to allowing to be discharged. Since then, patient has had no more clots and states that her pain has been much better since passing the clot. Patient also states that she has had very little bleeding. Fundal assessment was within normal limits with scant bleeding. Called Dr. Nobie Putnam to notify of update. Dr. Nobie Putnam states that patient may be discharged home now. Earl Gala, Linda Hedges [place]

## 2022-07-15 NOTE — Progress Notes (Signed)
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 CSW met MOB at bedside to complete a mental health assessment. CSW entered the room, introduced herself and acknowledged that FOB was sleeping. MOB gave CSW verbal permission to speak about anything while FOB was present. CSW explained her role and the reason for the visit. MOB presented as calm, was agreeable to consult and remained engaged throughout encounter.  CSW asked MOB about her mental health history. 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 MOB reported symptoms that included being emotional, moody and isolation; these symptoms increased once she begin taking the Nexplanon. MOB reported not seeking medical support for symptoms; however she was supported by family. MOB denied the support of medication and participating in therapy. MOB reported a plan of action for this PP period that includes the support of family, and contacting her OB and/or PCP for support. CSW provided MOB with a list of therapist in the [place] area for support of symptoms. CSW provided education regarding the baby blues period vs. perinatal mood disorders, discussed treatment and gave resources for mental health follow up if concerns arise.  CSW recommends self-evaluation during the postpartum time period using the New Mom Checklist from Postpartum Progress and encouraged MOB to contact a medical professional if symptoms are noted at any time. CSW assessed for safety with MOB SI and HI; MOB denied all. CSW did not assess for DV; FOB was present.  CSW asked MOB has she chosen a pediatrician for infant's follow up visits; MOB said [facility]. MOB reported having all essential items for infant including a car seat and bassinet for safe sleeping. CSW provided review of Sudden Infant Death Syndrome (SIDS) precautions.  CSW identifies no further need for intervention and no barriers to discharge at this  time.  Enos Fling, Theresia Majors Clinical Social Worker [phone]

## 2022-07-25 ENCOUNTER — Telehealth (HOSPITAL_COMMUNITY): Payer: Self-pay | Admitting: *Deleted

## 2022-07-25 NOTE — Telephone Encounter (Signed)
Patient voiced no questions or concerns regarding her health at this time. EPDS=3. Patient reported that she recently noticed that infant "sometimes gags after we feed him and lay him down." RN suggested that patient feed infant and then hold at >45 degree angle for at least 15 minutes before laying infant down. Patient reported that baby has an appointment with pediatrician on 5/24 - plans to discuss with provider at that time. Patient voiced no other questions or concerns regarding infant at this time. RN reviewed ABCs of safe sleep. Patient verbalized understanding. Patient requested RN email information on hospital's Baby and Me class. Email sent. Deforest Hoyles, RN, 07/25/22, [phone]

## 2022-07-27 ENCOUNTER — Encounter: Payer: Self-pay | Admitting: Obstetrics and Gynecology

## 2022-07-27 ENCOUNTER — Ambulatory Visit: Payer: Medicaid Other | Admitting: Obstetrics and Gynecology

## 2022-07-27 VITALS — BP 102/67 | HR 73 | Ht 63.0 in | Wt 250.4 lb

## 2022-07-27 DIAGNOSIS — Z3009 Encounter for other general counseling and advice on contraception: Secondary | ICD-10-CM | POA: Diagnosis not present

## 2022-07-27 NOTE — Progress Notes (Signed)
Sydney Kelley is here to discuss BTL BTL papers signed  PE AF VSS Lungs clear Heart RRR Abd soft + BS  A/P Unwanted fertility  Patient desires bilateral tubal sterilization.  Other reversible forms of contraception were discussed with patient; she declines all other modalities. Discussed bilateral tubal sterilization in detail; discussed options of laparoscopic bilateral tubal sterilization using Filshie clips vs laparoscopic bilateral salpingectomy. Risks and benefits discussed in detail including but not limited to: risk of regret, permanence of method, bleeding, infection, injury to surrounding organs and need for additional procedures.  Failure risk of 1-2 % for Filshie clips and <1% for bilateral salpingectomy with increased risk of ectopic gestation if pregnancy occurs was also discussed with patient.  Also discussed possible reduction of risk of ovarian cancer via bilateral salpingectomy given that a growing body of knowledge reveals that the majority of cases of high grade serous "ovarian" cancer actually are actually  cancers arising from the fimbriated end of the fallopian tubes. Emphasized that removal of fallopian tubes do not result in any known hormonal imbalance.  Patient verbalized understanding of these risks and benefits and wants to proceed with sterilization with laparoscopic bilateral sterilization via bilateral salpingectomy    She was told that she will be contacted by our surgical scheduler regarding the time and date of her surgery; routine preoperative instructions of having nothing to eat or drink after midnight on the day prior to surgery and also coming to the hospital 1 1/2 hours prior to her time of surgery were also emphasized.  She was told she may be called for a preoperative appointment about a week prior to surgery and will be given further preoperative instructions at that visit.  Routine postoperative instructions will be reviewed with the patient and her family in detail  after surgery. Printed patient education handouts about the procedure was given to the patient to review at home.  Medicaid papers have been signed , patient understands that surgery will be scheduled at least 30 days after the day papers are signed as per Medicaid guidelines.  In the meantime, patient will use condoms for contraception prior to surgery.

## 2022-07-27 NOTE — Progress Notes (Signed)
Pt presents for tubal ligation consult. Vaginal delivery on 5/11. Consent signed May 9th.  Seen on 5/15 for retained placental products.

## 2022-08-16 ENCOUNTER — Ambulatory Visit: Payer: Medicaid Other | Admitting: Family Medicine

## 2022-08-17 ENCOUNTER — Encounter: Payer: Self-pay | Admitting: Obstetrics and Gynecology

## 2022-08-17 ENCOUNTER — Ambulatory Visit (INDEPENDENT_AMBULATORY_CARE_PROVIDER_SITE_OTHER): Payer: Medicaid Other | Admitting: Obstetrics and Gynecology

## 2022-08-17 NOTE — Progress Notes (Signed)
..   Post Partum Visit Note  Sydney Kelley is a 22 y.o. [phone] female who presents for a postpartum visit. She is 4 weeks postpartum following a normal spontaneous vaginal delivery.  I have fully reviewed the prenatal and intrapartum course. The delivery was at 39.2 gestational weeks.  Anesthesia: epidural. Postpartum course has been good. Baby is doing well. Baby is feeding by bottle - Similac Advance. Bleeding no bleeding. Bowel function is normal. Bladder function is normal. Patient is sexually active. Contraception method is scheduled for tubal ligation. Postpartum depression screening: negative.   The pregnancy intention screening data noted above was reviewed. Potential methods of contraception were discussed. The patient elected to proceed with BTL (scheduled for 10/03/2022).   Edinburgh Postnatal Depression Scale - 08/17/22 1108       Edinburgh Postnatal Depression Scale:  In the Past 7 Days   I have been able to laugh and see the funny side of things. 0    I have looked forward with enjoyment to things. 0    I have blamed myself unnecessarily when things went wrong. 0    I have been anxious or worried for no good reason. 0    I have felt scared or panicky for no good reason. 0    Things have been getting on top of me. 2    I have been so unhappy that I have had difficulty sleeping. 0    I have felt sad or miserable. 0    I have been so unhappy that I have been crying. 0    The thought of harming myself has occurred to me. 0    Edinburgh Postnatal Depression Scale Total 2             Health Maintenance Due  Topic Date Due   COVID-19 Vaccine (1) Never done   DTaP/Tdap/Td (1 - Tdap) Never done    The following portions of the patient's history were reviewed and updated as appropriate: allergies, current medications, past family history, past medical history, past social history, past surgical history, and problem list.  Review of Systems Constitutional:  negative Eyes: negative Ears, nose, mouth, throat, and face: negative Respiratory: negative Cardiovascular: negative Gastrointestinal: negative Genitourinary:negative Integument/breast: negative Hematologic/lymphatic: negative Musculoskeletal:negative Neurological: negative Behavioral/Psych: negative Endocrine: negative Allergic/Immunologic: negative  Objective:  BP 118/88   Pulse 89   Wt 250 lb 3.2 oz (113.5 kg)   Breastfeeding No   BMI 44.32 kg/m    General:  alert, cooperative, appears stated age, and no distress   Breasts:  not indicated  Lungs: clear to auscultation bilaterally  Heart:  Not examined  Abdomen: soft, non-tender; bowel sounds normal; no masses,  no organomegaly   Wound N/a  GU exam:  not indicated       Assessment:    Encounter for routine postpartum follow-up   Normal postpartum exam.   Plan:   Essential components of care per ACOG recommendations:  1.  Mood and well being: Patient with negative depression screening today. Reviewed local resources for support.  - Patient tobacco use? No.   - hx of drug use? No.    2. Infant care and feeding: formula -Patient currently breastmilk feeding? No.  -Social determinants of health (SDOH) reviewed in EPIC. No concerns  3. Sexuality, contraception and birth spacing - Patient does not want a pregnancy in the next year.  Desired family size is 2 children.  - Reviewed reproductive life planning. Reviewed contraceptive methods based on  pt preferences and effectiveness.  Patient desires Female Sterilization. Encouraged to use condoms consistently and EVERY time until BTL surgery is completed.  - Discussed birth spacing of 18 months  4. Sleep and fatigue -Encouraged family/partner/community support of 4 hrs of uninterrupted sleep to help with mood and fatigue  5. Physical Recovery  - Discussed patients delivery and complications. She describes her labor as good. - Patient had a Vaginal, no problems at  delivery. Patient had  no  laceration. Patient expressed understanding - Patient has urinary incontinence? No. - Patient is safe to resume physical and sexual activity  6.  Health Maintenance - HM due items addressed No - none due at this time - Last pap smear  Diagnosis  Date Value Ref Range Status  01/10/2022   Final   - Negative for intraepithelial lesion or malignancy (NILM)   Pap smear not done at today's visit.  -Breast Cancer screening indicated? No.   7. Chronic Disease/Pregnancy Condition follow up: None  - PCP follow up  Raelyn Mora, CNM [facility] [employer], [facility]

## 2022-09-27 NOTE — Pre-Procedure Instructions (Signed)
Surgical Instructions   Your procedure is scheduled on Tuesday, July 30th. Report to [facility] Main Entrance "A" at 11:30 A.M., then check in with the Admitting office. Any questions or running late day of surgery: call [phone]  Questions prior to your surgery date: call [phone], Monday-Friday, 8am-4pm. If you experience any cold or flu symptoms such as cough, fever, chills, shortness of breath, etc. between now and your scheduled surgery, please notify us at the above number.     Remember:  Do not eat after midnight the night before your surgery   You may drink clear liquids until 10:30 AM the morning of your surgery.   Clear liquids allowed are: Water, Non-Citrus Juices (without pulp), Carbonated Beverages, Clear Tea, Black Coffee Only (NO MILK, CREAM OR POWDERED CREAMER of any kind), and Gatorade.    Take these medicines the morning of surgery with A SIP OF WATER: none    One week prior to surgery, STOP taking any Aspirin (unless otherwise instructed by your surgeon) Aleve, Naproxen, Ibuprofen, Motrin, Advil, Goody's, BC's, all herbal medications, fish oil, and non-prescription vitamins.                     Do NOT Smoke (Tobacco/Vaping) for 24 hours prior to your procedure.  If you use a CPAP at night, you may bring your mask/headgear for your overnight stay.   You will be asked to remove any contacts, glasses, piercing's, hearing aid's, dentures/partials prior to surgery. Please bring cases for these items if needed.    Patients discharged the day of surgery will not be allowed to drive home, and someone needs to stay with them for 24 hours.  SURGICAL WAITING ROOM VISITATION Patients may have no more than 2 support people in the waiting area - these visitors may rotate.   Pre-op nurse will coordinate an appropriate time for 1 ADULT support person, who may not rotate, to accompany patient in pre-op.  Children under the age of 40 must have an adult with them who is  not the patient and must remain in the main waiting area with an adult.  If the patient needs to stay at the hospital during part of their recovery, the visitor guidelines for inpatient rooms apply.  Please refer to the [facility] website for the visitor guidelines for any additional information.   If you received a COVID test during your pre-op visit  it is requested that you wear a mask when out in public, stay away from anyone that may not be feeling well and notify your surgeon if you develop symptoms. If you have been in contact with anyone that has tested positive in the last 10 days please notify you surgeon.      Pre-operative CHG Bathing Instructions   You can play a key role in reducing the risk of infection after surgery. Your skin needs to be as free of germs as possible. You can reduce the number of germs on your skin by washing with CHG (chlorhexidine gluconate) soap before surgery. CHG is an antiseptic soap that kills germs and continues to kill germs even after washing.   DO NOT use if you have an allergy to chlorhexidine/CHG or antibacterial soaps. If your skin becomes reddened or irritated, stop using the CHG and notify one of our RNs at [phone].              TAKE A SHOWER THE NIGHT BEFORE SURGERY AND THE DAY OF SURGERY  Please keep in mind the following:  DO NOT shave, including legs and underarms, 48 hours prior to surgery.   You may shave your face before/day of surgery.  Place clean sheets on your bed the night before surgery Use a clean washcloth (not used since being washed) for each shower. DO NOT sleep with pet's night before surgery.  CHG Shower Instructions:  If you choose to wash your hair and private area, wash first with your normal shampoo/soap.  After you use shampoo/soap, rinse your hair and body thoroughly to remove shampoo/soap residue.  Turn the water OFF and apply half the bottle of CHG soap to a CLEAN washcloth.  Apply CHG soap ONLY FROM  YOUR NECK DOWN TO YOUR TOES (washing for 3-5 minutes)  DO NOT use CHG soap on face, private areas, open wounds, or sores.  Pay special attention to the area where your surgery is being performed.  If you are having back surgery, having someone wash your back for you may be helpful. Wait 2 minutes after CHG soap is applied, then you may rinse off the CHG soap.  Pat dry with a clean towel  Put on clean pajamas    Additional instructions for the day of surgery: DO NOT APPLY any lotions, deodorants, cologne, or perfumes.   Do not wear jewelry or makeup Do not wear nail polish, gel polish, artificial nails, or any other type of covering on natural nails (fingers and toes) Do not bring valuables to the hospital. [facility] is not responsible for valuables/personal belongings. Put on clean/comfortable clothes.  Please brush your teeth.  Ask your nurse before applying any prescription medications to the skin.

## 2022-09-28 ENCOUNTER — Encounter (HOSPITAL_BASED_OUTPATIENT_CLINIC_OR_DEPARTMENT_OTHER): Payer: Self-pay | Admitting: Obstetrics and Gynecology

## 2022-09-28 ENCOUNTER — Other Ambulatory Visit: Payer: Self-pay

## 2022-09-28 ENCOUNTER — Encounter (HOSPITAL_COMMUNITY): Payer: Self-pay

## 2022-09-28 ENCOUNTER — Encounter (HOSPITAL_COMMUNITY)
Admission: RE | Admit: 2022-09-28 | Discharge: 2022-09-28 | Disposition: A | Discharge status: Home / Self Care | Payer: Medicaid Other | Source: Ambulatory Visit | Attending: Obstetrics and Gynecology | Admitting: Obstetrics and Gynecology

## 2022-09-28 VITALS — BP 104/69 | HR 100 | Temp 98.6°F | Resp 18 | Ht 64.0 in | Wt 245.7 lb

## 2022-09-28 DIAGNOSIS — Z01818 Encounter for other preprocedural examination: Secondary | ICD-10-CM | POA: Diagnosis not present

## 2022-09-28 HISTORY — DX: Headache, unspecified: R51.9

## 2022-09-28 LAB — CBC
HCT: 37.9 % (ref 36.0–46.0)
Hemoglobin: 11.8 g/dL — ABNORMAL LOW (ref 12.0–15.0)
MCH: 25.1 pg — ABNORMAL LOW (ref 26.0–34.0)
MCHC: 31.1 g/dL (ref 30.0–36.0)
MCV: 80.6 fL (ref 80.0–100.0)
Platelets: 220 10*3/uL (ref 150–400)
RBC: 4.7 MIL/uL (ref 3.87–5.11)
RDW: 17 % — ABNORMAL HIGH (ref 11.5–15.5)
WBC: 9.3 10*3/uL (ref 4.0–10.5)
nRBC: 0 % (ref 0.0–0.2)

## 2022-09-28 NOTE — Progress Notes (Signed)
PCP -  Denies Cardiologist - Denies  PPM/ICD - Denies Device Orders - n/a Rep Notified - n/a  Chest x-ray - Denies EKG - denies Stress Test - Denies ECHO - Denies Cardiac Cath - Denies  Sleep Study - Denies  DM-  Denies  Blood Thinner Instructions: Denies Aspirin Instructions:Denies  ERAS Protcol - yes no drink PRE-SURGERY Ensure or G2- n/a  COVID TEST- n/a   Anesthesia review: no  Patient denies shortness of breath, fever, cough and chest pain at PAT appointment   All instructions explained to the patient, with a verbal understanding of the material. Patient agrees to go over the instructions while at home for a better understanding. Patient also instructed to self quarantine after being tested for COVID-19. The opportunity to ask questions was provided.

## 2022-09-28 NOTE — Progress Notes (Addendum)
Spoke w/ via phone for pre-op interview---  pt, told pt of change of facility Lab needs dos----  urine preg             Lab results------ pt had cbc done today 09-28-2022 at [facility] in epic COVID test -----patient states asymptomatic no test needed Arrive at ------- 1130 on 10-03-2022 NPO after MN NO Solid Food.  Clear liquids from MN until--- 1030 Med rec completed Medications to take morning of surgery ----- none Diabetic medication ----- n/a Patient instructed no nail polish to be worn day of surgery Patient instructed to bring photo id and insurance card day of surgery Patient aware to have Driver (ride ) / caregiver    for 24 hours after surgery --fiance, erick Patient Special Instructions ----- n/a Pre-Op special Instructions -----  pt case moved from [facility] to [facility] today , she already had PAT appt done this morning, anesthesia orders already entered, pt told she can shower with soap given her is she would like or she can wait and do the wipes in pre-op dos. PAT had already called and requested orders. Patient verbalized understanding of instructions that were given at this phone interview. Patient denies shortness of breath, chest pain, fever, cough at this phone interview.

## 2022-09-29 NOTE — H&P (Addendum)
Sydney Kelley Sydney Kelley is an 22 y.o. 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    Past Medical History:  Diagnosis Date   Headache    patient reports every now and then    Past Surgical History:  Procedure Laterality Date   NO PAST SURGERIES     WISDOM TOOTH EXTRACTION      Family History  Problem Relation Age of Onset   Diabetes Father    Diabetes Paternal Uncle    Cancer Other    Asthma Other    Heart disease Neg Hx    Hypertension Neg Hx     Social History:  reports that she has never smoked. She has never used smokeless tobacco. She reports that she does not drink alcohol and does not use drugs.  Allergies:  Allergies  Allergen Reactions   Amoxicillin Other (See Comments)    Gi upset    No medications prior to admission.    Review of Systems  Constitutional: Negative.   Respiratory: Negative.    Cardiovascular: Negative.   Gastrointestinal: Negative.   Genitourinary: Negative.     Last menstrual period 09/26/2022, not currently breastfeeding. Physical Exam Constitutional:      Appearance: Normal appearance.  Cardiovascular:     Rate and Rhythm: Normal rate and regular rhythm.  Pulmonary:     Effort: Pulmonary effort is normal.     Breath sounds: Normal breath sounds.  Abdominal:     General: Bowel sounds are normal.     Palpations: Abdomen is soft.  Genitourinary:    Comments: Nl EGBUS, small mobile uterus, no masses Neurological:     Mental Status: She is alert.     No results found for this or any previous visit (from the past 24 hour(s)).  No results found.  Assessment/Plan: Unwanted Fertility  Patient desires bilateral tubal sterilization.  Other reversible forms of contraception were discussed with patient; she declines all other modalities. Discussed bilateral tubal sterilization in detail; discussed options of laparoscopic bilateral tubal sterilization  using Filshie clips vs laparoscopic bilateral salpingectomy. Risks and benefits discussed in detail including but not limited to: risk of regret, permanence of method, bleeding, infection, injury to surrounding organs and need for additional procedures.  Failure risk of 1-2 % for Filshie clips and <1% for bilateral salpingectomy with increased risk of ectopic gestation if pregnancy occurs was also discussed with patient.  Also discussed possible reduction of risk of ovarian cancer via bilateral salpingectomy given that a growing body of knowledge reveals that the majority of cases of high grade serous "ovarian" cancer actually are actually  cancers arising from the fimbriated end of the fallopian tubes. Emphasized that removal of fallopian tubes do not result in any known hormonal imbalance.  Patient verbalized understanding of these risks and benefits and wants to proceed with sterilization with laparoscopic bilateral sterilization.  Sydney Kelley 09/29/2022, 1:43 PM

## 2022-10-02 NOTE — Anesthesia Preprocedure Evaluation (Signed)
Anesthesia Evaluation  Patient identified by MRN, date of birth, ID band Patient awake    Reviewed: Allergy & Precautions, NPO status , Patient's Chart, lab work & pertinent test results  Airway Mallampati: III  TM Distance: >3 FB Neck ROM: Full    Dental  (+) Chipped, Dental Advisory Given,    Pulmonary neg pulmonary ROS   Pulmonary exam normal breath sounds clear to auscultation       Cardiovascular negative cardio ROS Normal cardiovascular exam Rhythm:Regular Rate:Normal     Neuro/Psych  Headaches  negative psych ROS   GI/Hepatic negative GI ROS, Neg liver ROS,,,  Endo/Other    Morbid obesityBMI 42  Renal/GU negative Renal ROS  negative genitourinary   Musculoskeletal negative musculoskeletal ROS (+)    Abdominal  (+) + obese  Peds  Hematology negative hematology ROS (+) Hb 11.8,   Anesthesia Other Findings   Reproductive/Obstetrics Desires sterility Recent pregnancy 07/14/22, not breastfeeding                             Anesthesia Physical Anesthesia Plan  ASA: 3  Anesthesia Plan: General   Post-op Pain Management: Tylenol PO (pre-op)* and Toradol IV (intra-op)*   Induction: Intravenous  PONV Risk Score and Plan: 4 or greater and Ondansetron, Dexamethasone, Midazolam, Scopolamine patch - Pre-op and Treatment may vary due to age or medical condition  Airway Management Planned: Oral ETT  Additional Equipment: None  Intra-op Plan:   Post-operative Plan: Extubation in OR  Informed Consent: I have reviewed the patients History and Physical, chart, labs and discussed the procedure including the risks, benefits and alternatives for the proposed anesthesia with the patient or authorized representative who has indicated his/her understanding and acceptance.     Dental advisory given  Plan Discussed with: CRNA  Anesthesia Plan Comments:        Anesthesia Quick  Evaluation

## 2022-10-03 ENCOUNTER — Other Ambulatory Visit: Payer: Self-pay

## 2022-10-03 ENCOUNTER — Encounter (HOSPITAL_BASED_OUTPATIENT_CLINIC_OR_DEPARTMENT_OTHER)
Admission: RE | Disposition: A | Discharge status: Home / Self Care | Payer: Self-pay | Source: Ambulatory Visit | Attending: Obstetrics and Gynecology

## 2022-10-03 ENCOUNTER — Ambulatory Visit (HOSPITAL_BASED_OUTPATIENT_CLINIC_OR_DEPARTMENT_OTHER): Payer: Medicaid Other | Admitting: Anesthesiology

## 2022-10-03 ENCOUNTER — Encounter (HOSPITAL_BASED_OUTPATIENT_CLINIC_OR_DEPARTMENT_OTHER): Payer: Self-pay | Admitting: Obstetrics and Gynecology

## 2022-10-03 ENCOUNTER — Ambulatory Visit (HOSPITAL_BASED_OUTPATIENT_CLINIC_OR_DEPARTMENT_OTHER)
Admission: RE | Admit: 2022-10-03 | Discharge: 2022-10-03 | Disposition: A | Discharge status: Home / Self Care | Payer: Medicaid Other | Source: Ambulatory Visit | Attending: Obstetrics and Gynecology | Admitting: Obstetrics and Gynecology

## 2022-10-03 DIAGNOSIS — Z302 Encounter for sterilization: Secondary | ICD-10-CM | POA: Insufficient documentation

## 2022-10-03 DIAGNOSIS — Z3009 Encounter for other general counseling and advice on contraception: Secondary | ICD-10-CM

## 2022-10-03 DIAGNOSIS — Z6841 Body Mass Index (BMI) 40.0 and over, adult: Secondary | ICD-10-CM

## 2022-10-03 HISTORY — PX: LAPAROSCOPIC BILATERAL SALPINGECTOMY: SHX5889

## 2022-10-03 LAB — BASIC METABOLIC PANEL
Anion gap: 11 (ref 5–15)
BUN: 9 mg/dL (ref 6–20)
CO2: 22 mmol/L (ref 22–32)
Calcium: 9.3 mg/dL (ref 8.9–10.3)
Chloride: 104 mmol/L (ref 98–111)
Creatinine, Ser: 0.49 mg/dL (ref 0.44–1.00)
GFR, Estimated: >60 mL/min (ref >60)
Glucose, Bld: 100 mg/dL — ABNORMAL HIGH (ref 70–99)
Potassium: 3.5 mmol/L (ref 3.5–5.1)
Sodium: 137 mmol/L (ref 135–145)

## 2022-10-03 LAB — CBC
HCT: 40.2 % (ref 36.0–46.0)
Hemoglobin: 12.4 g/dL (ref 12.0–15.0)
MCH: 25.2 pg — ABNORMAL LOW (ref 26.0–34.0)
MCHC: 30.8 g/dL (ref 30.0–36.0)
MCV: 81.7 fL (ref 80.0–100.0)
Platelets: 235 10*3/uL (ref 150–400)
RBC: 4.92 MIL/uL (ref 3.87–5.11)
RDW: 16.4 % — ABNORMAL HIGH (ref 11.5–15.5)
WBC: 8.1 10*3/uL (ref 4.0–10.5)
nRBC: 0 % (ref 0.0–0.2)

## 2022-10-03 LAB — POCT PREGNANCY, URINE: Preg Test, Ur: NEGATIVE

## 2022-10-03 SURGERY — SALPINGECTOMY, BILATERAL, LAPAROSCOPIC
Anesthesia: General | Site: Abdomen | Laterality: Bilateral

## 2022-10-03 MED ORDER — KETOROLAC TROMETHAMINE 30 MG/ML IJ SOLN: 30.0000 mg | INTRAMUSCULAR | Status: DC

## 2022-10-03 MED ORDER — MEPERIDINE HCL 25 MG/ML IJ SOLN: 6.2500 mg | INTRAMUSCULAR | Status: DC | PRN

## 2022-10-03 MED ORDER — ROCURONIUM BROMIDE 10 MG/ML (PF) SYRINGE
PREFILLED_SYRINGE | INTRAVENOUS | Status: DC | PRN
  Administered 2022-10-03: 70 mg via INTRAVENOUS

## 2022-10-03 MED ORDER — SCOPOLAMINE 1 MG/3DAYS TD PT72
MEDICATED_PATCH | TRANSDERMAL | Status: AC
  Filled 2022-10-03: qty 1

## 2022-10-03 MED ORDER — HYDROMORPHONE HCL 1 MG/ML IJ SOLN: 0.2500 mg | INTRAMUSCULAR | Status: DC | PRN

## 2022-10-03 MED ORDER — PHENYLEPHRINE 80 MCG/ML (10ML) SYRINGE FOR IV PUSH (FOR BLOOD PRESSURE SUPPORT)
PREFILLED_SYRINGE | INTRAVENOUS | Status: DC | PRN
  Administered 2022-10-03 (×4): 160 ug via INTRAVENOUS

## 2022-10-03 MED ORDER — ROCURONIUM BROMIDE 10 MG/ML (PF) SYRINGE
PREFILLED_SYRINGE | INTRAVENOUS | Status: AC
  Filled 2022-10-03: qty 10

## 2022-10-03 MED ORDER — PROPOFOL 10 MG/ML IV BOLUS
INTRAVENOUS | Status: DC | PRN
  Administered 2022-10-03: 200 mg via INTRAVENOUS

## 2022-10-03 MED ORDER — PROPOFOL 10 MG/ML IV BOLUS
INTRAVENOUS | Status: AC
  Filled 2022-10-03: qty 20

## 2022-10-03 MED ORDER — DEXAMETHASONE SODIUM PHOSPHATE 10 MG/ML IJ SOLN
INTRAMUSCULAR | Status: DC | PRN
  Administered 2022-10-03: 10 mg via INTRAVENOUS

## 2022-10-03 MED ORDER — MIDAZOLAM HCL 2 MG/2ML IJ SOLN
INTRAMUSCULAR | Status: AC
  Filled 2022-10-03: qty 2

## 2022-10-03 MED ORDER — PHENYLEPHRINE 80 MCG/ML (10ML) SYRINGE FOR IV PUSH (FOR BLOOD PRESSURE SUPPORT)
PREFILLED_SYRINGE | INTRAVENOUS | Status: AC
  Filled 2022-10-03: qty 10

## 2022-10-03 MED ORDER — FENTANYL CITRATE (PF) 100 MCG/2ML IJ SOLN
INTRAMUSCULAR | Status: DC | PRN
  Administered 2022-10-03: 100 ug via INTRAVENOUS

## 2022-10-03 MED ORDER — ACETAMINOPHEN 500 MG PO TABS
1000.0000 mg | ORAL_TABLET | ORAL | Status: AC
  Administered 2022-10-03: 1000 mg via ORAL

## 2022-10-03 MED ORDER — LACTATED RINGERS IV SOLN: INTRAVENOUS | Status: DC

## 2022-10-03 MED ORDER — OXYCODONE HCL 5 MG PO TABS
5.0000 mg | ORAL_TABLET | Freq: Once | ORAL | Status: AC | PRN
  Administered 2022-10-03: 5 mg via ORAL

## 2022-10-03 MED ORDER — MIDAZOLAM HCL 5 MG/5ML IJ SOLN
INTRAMUSCULAR | Status: DC | PRN
  Administered 2022-10-03: 2 mg via INTRAVENOUS

## 2022-10-03 MED ORDER — IBUPROFEN 800 MG PO TABS: 800.0000 mg | ORAL_TABLET | Freq: Three times a day (TID) | ORAL | 0 refills | Status: AC | PRN

## 2022-10-03 MED ORDER — LIDOCAINE 2% (20 MG/ML) 5 ML SYRINGE
INTRAMUSCULAR | Status: DC | PRN
  Administered 2022-10-03: 60 mg via INTRAVENOUS

## 2022-10-03 MED ORDER — KETOROLAC TROMETHAMINE 30 MG/ML IJ SOLN: 30.0000 mg | Freq: Once | INTRAMUSCULAR | Status: DC | PRN

## 2022-10-03 MED ORDER — ACETAMINOPHEN 500 MG PO TABS
ORAL_TABLET | ORAL | Status: AC
  Filled 2022-10-03: qty 2

## 2022-10-03 MED ORDER — SCOPOLAMINE 1 MG/3DAYS TD PT72
1.0000 | MEDICATED_PATCH | TRANSDERMAL | Status: DC
  Administered 2022-10-03: 1.5 mg via TRANSDERMAL

## 2022-10-03 MED ORDER — DEXAMETHASONE SODIUM PHOSPHATE 10 MG/ML IJ SOLN
INTRAMUSCULAR | Status: AC
  Filled 2022-10-03: qty 1

## 2022-10-03 MED ORDER — BUPIVACAINE HCL 0.5 % IJ SOLN
INTRAMUSCULAR | Status: DC | PRN
  Administered 2022-10-03: 30 mL

## 2022-10-03 MED ORDER — DEXMEDETOMIDINE HCL IN NACL 80 MCG/20ML IV SOLN
INTRAVENOUS | Status: DC | PRN
  Administered 2022-10-03 (×2): 8 ug via INTRAVENOUS
  Administered 2022-10-03: 4 ug via INTRAVENOUS

## 2022-10-03 MED ORDER — SUGAMMADEX SODIUM 200 MG/2ML IV SOLN
INTRAVENOUS | Status: DC | PRN
  Administered 2022-10-03: 200 mg via INTRAVENOUS

## 2022-10-03 MED ORDER — ONDANSETRON HCL 4 MG/2ML IJ SOLN
INTRAMUSCULAR | Status: DC | PRN
  Administered 2022-10-03: 4 mg via INTRAVENOUS

## 2022-10-03 MED ORDER — SODIUM CHLORIDE 0.9 % IV SOLN
INTRAVENOUS | Status: AC
  Filled 2022-10-03: qty 2

## 2022-10-03 MED ORDER — OXYCODONE HCL 5 MG/5ML PO SOLN: 5.0000 mg | Freq: Once | ORAL | Status: AC | PRN

## 2022-10-03 MED ORDER — SOD CITRATE-CITRIC ACID 500-334 MG/5ML PO SOLN: 30.0000 mL | ORAL | Status: DC

## 2022-10-03 MED ORDER — ONDANSETRON HCL 4 MG/2ML IJ SOLN
INTRAMUSCULAR | Status: AC
  Filled 2022-10-03: qty 2

## 2022-10-03 MED ORDER — FENTANYL CITRATE (PF) 100 MCG/2ML IJ SOLN
INTRAMUSCULAR | Status: AC
  Filled 2022-10-03: qty 2

## 2022-10-03 MED ORDER — AMISULPRIDE (ANTIEMETIC) 5 MG/2ML IV SOLN: 10.0000 mg | Freq: Once | INTRAVENOUS | Status: DC | PRN

## 2022-10-03 MED ORDER — ONDANSETRON HCL 4 MG/2ML IJ SOLN: 4.0000 mg | Freq: Once | INTRAMUSCULAR | Status: DC | PRN

## 2022-10-03 MED ORDER — OXYCODONE HCL 5 MG PO TABS
ORAL_TABLET | ORAL | Status: AC
  Filled 2022-10-03: qty 1

## 2022-10-03 MED ORDER — POVIDONE-IODINE 10 % EX SWAB: 2.0000 | Freq: Once | CUTANEOUS | Status: DC

## 2022-10-03 MED ORDER — SODIUM CHLORIDE 0.9 % IV SOLN
2.0000 g | INTRAVENOUS | Status: AC
  Administered 2022-10-03: 2 g via INTRAVENOUS

## 2022-10-03 MED ORDER — OXYCODONE HCL 5 MG PO TABS: 5.0000 mg | ORAL_TABLET | Freq: Four times a day (QID) | ORAL | 0 refills | Status: AC | PRN

## 2022-10-03 MED ORDER — KETOROLAC TROMETHAMINE 30 MG/ML IJ SOLN
INTRAMUSCULAR | Status: DC | PRN
  Administered 2022-10-03: 30 mg via INTRAVENOUS

## 2022-10-03 MED ORDER — DEXMEDETOMIDINE HCL IN NACL 80 MCG/20ML IV SOLN
INTRAVENOUS | Status: AC
  Filled 2022-10-03: qty 20

## 2022-10-03 SURGICAL SUPPLY — 24 items
ADH SKN CLS APL DERMABOND .7 (GAUZE/BANDAGES/DRESSINGS) ×1
ANTIFOG SOL W/FOAM PAD STRL (MISCELLANEOUS) ×1
CATH ROBINSON RED A/P 16FR (CATHETERS) IMPLANT
DERMABOND ADVANCED .7 DNX12 (GAUZE/BANDAGES/DRESSINGS) IMPLANT
DRAPE SURG IRRIG POUCH 19X23 (DRAPES) ×1 IMPLANT
DURAPREP 26ML APPLICATOR (WOUND CARE) ×1 IMPLANT
GLOVE BIO SURGEON STRL SZ7.5 (GLOVE) ×1 IMPLANT
GLOVE BIOGEL PI IND STRL 8 (GLOVE) ×1 IMPLANT
GOWN STRL REUS W/TWL XL LVL3 (GOWN DISPOSABLE) ×1 IMPLANT
KIT PINK PAD W/HEAD ARE REST (MISCELLANEOUS) ×1
KIT PINK PAD W/HEAD ARM REST (MISCELLANEOUS) ×1 IMPLANT
PACK LAPAROSCOPY BASIN (CUSTOM PROCEDURE TRAY) ×1 IMPLANT
PAD OB MATERNITY 4.3X12.25 (PERSONAL CARE ITEMS) ×1 IMPLANT
PAD PREP 24X48 CUFFED NSTRL (MISCELLANEOUS) ×1 IMPLANT
SET TUBE SMOKE EVAC HIGH FLOW (TUBING) ×1 IMPLANT
SHEARS HARMONIC ACE PLUS 36CM (ENDOMECHANICALS) IMPLANT
SLEEVE SCD COMPRESS KNEE MED (STOCKING) ×1 IMPLANT
SOLUTION ANTFG W/FOAM PAD STRL (MISCELLANEOUS) ×1 IMPLANT
SUT MNCRL AB 4-0 PS2 18 (SUTURE) ×1 IMPLANT
SUT VICRYL 0 UR6 27IN ABS (SUTURE) ×2 IMPLANT
TOWEL OR 17X24 6PK STRL BLUE (TOWEL DISPOSABLE) ×1 IMPLANT
TROCAR BALLN 12MMX100 BLUNT (TROCAR) ×1 IMPLANT
TROCAR Z-THREAD FIOS 5X100MM (TROCAR) IMPLANT
WARMER LAPAROSCOPE (MISCELLANEOUS) ×1 IMPLANT

## 2022-10-03 NOTE — Discharge Instructions (Addendum)
Laparoscopic BilateralTubal Removal/Laparoscopic Bilateral Salpingectomy Laparoscopic tubal removal is a procedure that removes the fallopian tubes at a time other than right after childbirth. By removing the fallopian tubes, the eggs that are released from the ovaries cannot enter the uterus and sperm cannot reach the egg. This is more effective than tubal ligation which is also known as getting your "tubes tied." Tubal removal is done so you will not be able to get pregnant or have a baby.  This procedure is permanent and irreversible. If you want to have future pregnancies, you should not have this procedure.  LET YOUR CAREGIVER KNOW ABOUT: Allergies to food or medicine. Medicines taken, including vitamins, herbs, eyedrops, over-the-counter medicines, and creams. Use of steroids (by mouth or creams). Previous problems with numbing medicines. History of bleeding problems or blood clots. Any recent colds or infections. Previous surgery. Other health problems, including diabetes and kidney problems. Possibility of pregnancy, if this applies. Any past pregnancies. RISKS AND COMPLICATIONS  Infection. Bleeding. Injury to surrounding organs. Anesthetic side effects. Failure of the procedure. Ectopic pregnancy. Future regret about having the procedure done. BEFORE THE PROCEDURE Do not take aspirin or blood thinners a week before the procedure or as directed. This can cause bleeding. Do not eat or drink anything 6 to 8 hours before the procedure. PROCEDURE  You may be given a medicine to help you relax (sedative) before the procedure. You will be given a medicine to make you sleep (general anesthetic) during the procedure. A tube will be put down your throat to help your breath while under general anesthesia. Two small cuts (incisions) are made in the lower abdominal area and one incision is made near the belly button. Your abdominal area will be inflated with a safe gas (carbon dioxide). This  helps give the surgeon room to operate, visualize, and helps the surgeon avoid other organs. A thin, lighted tube (laparoscope) with a camera attached is inserted into your abdomen through the incision near the belly button. Other small instruments are also inserted through the other abdominal incisions. The fallopian tubes are located and are removed. After the fallopian tubes are removed, the gas is released from the abdomen. The incisions will be closed with stitches (sutures), and Dermabond. A bandage may be placed over the incisions. AFTER THE PROCEDURE  You will also have some mild abdominal discomfort for 3-7 days. You will be given pain medicine to ease any discomfort. As long as there are no problems, you may be allowed to go home. Someone will need to drive you home and be with you for at least 24 hours once home. You may have some mild discomfort in the throat. This is from the tube placed in your throat while you were sleeping. You may experience discomfort in the shoulder area from some trapped air between the liver and diaphragm. This sensation is normal and will slowly go away on its own. HOME CARE INSTRUCTIONS  Take all medicines as directed. Only take over-the-counter or prescription medicines for pain, discomfort, or fever as directed by your caregiver. Resume daily activities as directed. Showers are preferred over baths. You may resume sexual activities in 1 week or as directed. Do not drive while taking narcotics. SEEK MEDICAL CARE IF: . There is increasing abdominal pain. You feel lightheaded or faint. You have the chills. You have an oral temperature above 102 F (38.9 C). There is pus-like (purulent) drainage from any of the wounds. You are unable to pass gas or have a  bowel movement. You feel sick to your stomach (nauseous) or throw up (vomit). MAKE SURE YOU:  Understand these instructions. Will watch your condition. Will get help right away if you are not doing  well or get worse.  ExitCare Patient Information 2013 [place], [place].    Post Anesthesia Home Care Instructions  Activity: Get plenty of rest for the remainder of the day. A responsible individual must stay with you for 24 hours following the procedure.  For the next 24 hours, DO NOT: -Drive a car -Advertising copywriter -Drink alcoholic beverages -Take any medication unless instructed by your physician -Make any legal decisions or sign important papers.  Meals: Start with liquid foods such as gelatin or soup. Progress to regular foods as tolerated. Avoid greasy, spicy, heavy foods. If nausea and/or vomiting occur, drink only clear liquids until the nausea and/or vomiting subsides. Call your physician if vomiting continues.  Special Instructions/Symptoms: Your throat may feel dry or sore from the anesthesia or the breathing tube placed in your throat during surgery. If this causes discomfort, gargle with warm salt water. The discomfort should disappear within 24 hours.  If you had a scopolamine patch placed behind your ear for the management of post- operative nausea and/or vomiting:  1. The medication in the patch is effective for 72 hours, after which it should be removed.  Wrap patch in a tissue and discard in the trash. Wash hands thoroughly with soap and water. 2. You may remove the patch earlier than 72 hours if you experience unpleasant side effects which may include dry mouth, dizziness or visual disturbances. 3. Avoid touching the patch. Wash your hands with soap and water after contact with the patch. Please remove patch by Friday 10/06/22.    No ibuprofen, Advil, Aleve, Motrin, ketorolac, meloxicam, naproxen, or other NSAIDS until after 7:15 pm today if needed. No acetaminophen/Tylenol until after 4:30 pm today if needed.

## 2022-10-03 NOTE — Interval H&P Note (Signed)
History and Physical Interval Note:  10/03/2022 11:58 AM  Sydney Kelley  has presented today for surgery, with the diagnosis of Undesired Fertility.  The various methods of treatment have been discussed with the patient and family. After consideration of risks, benefits and other options for treatment, the patient has consented to  Procedure(s): LAPAROSCOPIC BILATERAL SALPINGECTOMY (Bilateral) as a surgical intervention.  The patient's history has been reviewed, patient examined, no change in status, stable for surgery.  I have reviewed the patient's chart and labs.  Questions were answered to the patient's satisfaction.     [name]

## 2022-10-03 NOTE — Transfer of Care (Signed)
Immediate Anesthesia Transfer of Care Note  Patient: Sydney Kelley  Procedure(s) Performed: LAPAROSCOPIC BILATERAL SALPINGECTOMY (Bilateral: Abdomen)  Patient Location: PACU  Anesthesia Type:General  Level of Consciousness: awake, alert , oriented, and patient cooperative  Airway & Oxygen Therapy: Patient Spontanous Breathing  Post-op Assessment: Report given to RN and Post -op Vital signs reviewed and stable  Post vital signs: Reviewed and stable  Last Vitals:  Vitals Value Taken Time  BP 126/71 10/03/22 1322  Temp 36.9 C 10/03/22 1322  Pulse 72 10/03/22 1325  Resp 19 10/03/22 1325  SpO2 95 % 10/03/22 1325  Vitals shown include unfiled device data.  Last Pain:  Vitals:   10/03/22 1039  TempSrc: Oral  PainSc: 0-No pain      Patients Stated Pain Goal: 3 (10/03/22 1039)  Complications: No notable events documented.

## 2022-10-03 NOTE — Op Note (Signed)
Sydney Kelley [employer] PROCEDURE DATE: 10/03/2022   PREOPERATIVE DIAGNOSIS:  Undesired fertility  POSTOPERATIVE DIAGNOSIS:  Undesired fertility  PROCEDURE:  Laparoscopic Bilateral Salpingectomy   SURGEON:Byron Peacock  ASSISTANT: NA  ANESTHESIA:  General endotracheal  COMPLICATIONS:  None immediate.  ESTIMATED BLOOD LOSS:  25 ml.  FLUIDS: As recorded  URINE OUTPUT:  As recorded  IINDICATIONS: 22 y.o. [name] with undesired fertility, desires permanent sterilization. Other reversible forms of contraception were discussed with patient; she declines all other modalities.  Risks of procedure discussed with patient including permanence of method, risk of regret, bleeding, infection, injury to surrounding organs and need for additional procedures including laparotomy.  Failure risk less than 0.5% with increased risk of ectopic gestation if pregnancy occurs was also discussed with patient.  Written informed consent was obtained.    FINDINGS:  Normal uterus, fallopian tubes, and ovaries.  TECHNIQUE:  The patient was taken to the operating room where general anesthesia was obtained without difficulty.  She was then placed in the dorsal lithotomy position and prepared and draped in sterile fashion.  After an adequate timeout was performed, a bivalved speculum was then placed in the patient's vagina, and the anterior lip of cervix grasped with the single-tooth tenaculum.  The uterine manipulator was then advanced into the uterus.  The speculum was removed from the vagina.  Attention was then turned to the patient's abdomen where a 11-mm skin incision was made in the umbilical fold.  The Optiview 11-mm trocar and sleeve were then advanced without difficulty with the laparoscope under direct visualization into the abdomen.  The abdomen was then insufflated with carbon dioxide gas.  Adequate pneumoperitoneum was obtained.  A survey of the patient's pelvis and abdomen revealed the findings above.  Bilateral 5-mm lower quadrant ports  were then placed under direct visualization.  The fallopian tubes were transected from the uterine attachments and the underlying mesosalpinx with the Harmonic device allowing for bilateral salpingectomy.  The fallopian tubes were then removed from the abdomen under direct visualization.  The operative site was surveyed, and it was found to be hemostatic.   No intraoperative injury to other surrounding organs was noted.  The abdomen was desufflated and all instruments were then removed from the patient's abdomen. The fascial incision of the umbilicus was closed with a 0 Vicryl figure of eight stitch. All skin incisions were closed with Dermabond.  The uterine manipulator was removed from the cervix without complications. The patient tolerated the procedure well.  Sponge, lap, and needle counts were correct times two.  The patient was then taken to the recovery room awake, extubated and in stable condition.  The patient will be discharged to home as per PACU criteria.  Routine postoperative instructions given.  She was prescribed Percocet & Ibuprofen.  She will follow up in the clinic  for postoperative evaluation.   Nettie Elm, MD, FACOG Attending Obstetrician & Gynecologist Faculty [facility], [facility]

## 2022-10-03 NOTE — Anesthesia Postprocedure Evaluation (Signed)
Anesthesia Post Note  Patient: Sydney Kelley  Procedure(s) Performed: LAPAROSCOPIC BILATERAL SALPINGECTOMY (Bilateral: Abdomen)     Patient location during evaluation: PACU Anesthesia Type: General Level of consciousness: awake and alert, oriented and patient cooperative Pain management: pain level controlled Vital Signs Assessment: post-procedure vital signs reviewed and stable Respiratory status: spontaneous breathing, nonlabored ventilation and respiratory function stable Cardiovascular status: blood pressure returned to baseline and stable Postop Assessment: no apparent nausea or vomiting Anesthetic complications: no   No notable events documented.  Last Vitals:  Vitals:   10/03/22 1345 10/03/22 1400  BP: 112/67 (!) 110/57  Pulse: (!) 59 61  Resp: 16 16  Temp:    SpO2: 96% 93%    Last Pain:  Vitals:   10/03/22 1416  TempSrc:   PainSc: 5                  Lannie Fields

## 2022-10-03 NOTE — Anesthesia Procedure Notes (Signed)
Procedure Name: Intubation Date/Time: 10/03/2022 12:10 PM  Performed by: Bishop Limbo, CRNAPre-anesthesia Checklist: Patient identified, Emergency Drugs available, Suction available and Patient being monitored Patient Re-evaluated:Patient Re-evaluated prior to induction Oxygen Delivery Method: Circle System Utilized Preoxygenation: Pre-oxygenation with 100% oxygen Induction Type: IV induction Ventilation: Mask ventilation without difficulty Laryngoscope Size: Mac and 3 Grade View: Grade I Tube type: Oral Tube size: 7.0 mm Number of attempts: 1 Airway Equipment and Method: Stylet Placement Confirmation: ETT inserted through vocal cords under direct vision, positive ETCO2 and breath sounds checked- equal and bilateral Secured at: 22 cm Tube secured with: Tape Dental Injury: Teeth and Oropharynx as per pre-operative assessment

## 2022-10-04 ENCOUNTER — Encounter (HOSPITAL_BASED_OUTPATIENT_CLINIC_OR_DEPARTMENT_OTHER): Payer: Self-pay | Admitting: Obstetrics and Gynecology

## 2022-10-09 ENCOUNTER — Telehealth: Payer: Self-pay

## 2022-10-09 NOTE — Telephone Encounter (Signed)
Patient called wanting to know if she can take off her honeycomb dressing from her salpingectomy from 7/30. I informed her that it was okay to remove honeycomb. Reports some pain around the bandage. Do you want her to come in for an incision check?

## 2022-10-24 ENCOUNTER — Encounter: Payer: Self-pay | Admitting: Obstetrics and Gynecology

## 2022-10-24 ENCOUNTER — Ambulatory Visit (INDEPENDENT_AMBULATORY_CARE_PROVIDER_SITE_OTHER): Payer: Medicaid Other | Admitting: Obstetrics and Gynecology

## 2022-10-24 VITALS — BP 115/75 | HR 85 | Ht 64.0 in | Wt 240.0 lb

## 2022-10-24 DIAGNOSIS — Z9889 Other specified postprocedural states: Secondary | ICD-10-CM | POA: Insufficient documentation

## 2022-10-24 DIAGNOSIS — Z9079 Acquired absence of other genital organ(s): Secondary | ICD-10-CM

## 2022-10-24 NOTE — Progress Notes (Addendum)
21 y.o. GYN presents for Post Op FU of  Laparoscopic Bilateral Salpingectomy.

## 2022-10-24 NOTE — Progress Notes (Signed)
Sydney Kelley presents for post op from Lap bilateral salpingectomy. Pt is doing well Denies any bowel of bladder dysfunction  PE AF VSS Lungs clear Heart RRR Abd soft + BS incisions well healed  A/P Post op         Lap Bilateral salpingectomy  Doing well. F/U PRN
